# Patient Record
Sex: Female | Born: 1995 | Race: White | Hispanic: No | Marital: Single | Smoking: Never smoker
Health system: Southern US, Community
[De-identification: ages and names within clinical notes are randomized; demographics above are authoritative.]

---
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
## 2019-02-26 DIAGNOSIS — S60519A Abrasion of unspecified hand, initial encounter: Secondary | ICD-10-CM | POA: Diagnosis not present

## 2019-02-26 DIAGNOSIS — L03119 Cellulitis of unspecified part of limb: Secondary | ICD-10-CM | POA: Diagnosis not present

## 2019-03-10 DIAGNOSIS — S60519A Abrasion of unspecified hand, initial encounter: Secondary | ICD-10-CM | POA: Diagnosis not present

## 2019-04-15 DIAGNOSIS — Z23 Encounter for immunization: Secondary | ICD-10-CM | POA: Diagnosis not present

## 2019-04-15 DIAGNOSIS — Z0000 Encounter for general adult medical examination without abnormal findings: Secondary | ICD-10-CM | POA: Diagnosis not present

## 2020-04-14 ENCOUNTER — Emergency Department (HOSPITAL_COMMUNITY)
Admission: EM | Admit: 2020-04-14 | Discharge: 2020-04-20 | Disposition: A | Discharge status: Other Acute Inpatient Hospital | Payer: Medicare Other | Attending: Emergency Medicine | Admitting: Emergency Medicine

## 2020-04-14 ENCOUNTER — Encounter (HOSPITAL_COMMUNITY): Payer: Self-pay

## 2020-04-14 ENCOUNTER — Other Ambulatory Visit: Payer: Self-pay

## 2020-04-14 DIAGNOSIS — Z79899 Other long term (current) drug therapy: Secondary | ICD-10-CM | POA: Insufficient documentation

## 2020-04-14 DIAGNOSIS — Z20822 Contact with and (suspected) exposure to covid-19: Secondary | ICD-10-CM | POA: Insufficient documentation

## 2020-04-14 DIAGNOSIS — Z0489 Encounter for examination and observation for other specified reasons: Secondary | ICD-10-CM | POA: Diagnosis present

## 2020-04-14 DIAGNOSIS — F4310 Post-traumatic stress disorder, unspecified: Secondary | ICD-10-CM | POA: Insufficient documentation

## 2020-04-14 DIAGNOSIS — R462 Strange and inexplicable behavior: Secondary | ICD-10-CM

## 2020-04-14 DIAGNOSIS — F840 Autistic disorder: Secondary | ICD-10-CM | POA: Diagnosis not present

## 2020-04-14 DIAGNOSIS — F4320 Adjustment disorder, unspecified: Secondary | ICD-10-CM | POA: Diagnosis not present

## 2020-04-14 DIAGNOSIS — F79 Unspecified intellectual disabilities: Secondary | ICD-10-CM | POA: Insufficient documentation

## 2020-04-14 DIAGNOSIS — R4182 Altered mental status, unspecified: Secondary | ICD-10-CM | POA: Diagnosis not present

## 2020-04-14 DIAGNOSIS — Z03818 Encounter for observation for suspected exposure to other biological agents ruled out: Secondary | ICD-10-CM | POA: Diagnosis not present

## 2020-04-14 DIAGNOSIS — R000 Tachycardia, unspecified: Secondary | ICD-10-CM | POA: Diagnosis not present

## 2020-04-14 LAB — CBC WITH DIFFERENTIAL/PLATELET
Abs Immature Granulocytes: 0.03 10*3/uL (ref 0.00–0.07)
Basophils Absolute: 0 10*3/uL (ref 0.0–0.1)
Basophils Relative: 0 %
Eosinophils Absolute: 0 10*3/uL (ref 0.0–0.5)
Eosinophils Relative: 0 %
HCT: 39.5 % (ref 36.0–46.0)
Hemoglobin: 12.7 g/dL (ref 12.0–15.0)
Immature Granulocytes: 0 %
Lymphocytes Relative: 13 %
Lymphs Abs: 1 10*3/uL (ref 0.7–4.0)
MCH: 29.6 pg (ref 26.0–34.0)
MCHC: 32.2 g/dL (ref 30.0–36.0)
MCV: 92.1 fL (ref 80.0–100.0)
Monocytes Absolute: 0.6 10*3/uL (ref 0.1–1.0)
Monocytes Relative: 7 %
Neutro Abs: 6.2 10*3/uL (ref 1.7–7.7)
Neutrophils Relative %: 80 %
Platelets: 359 10*3/uL (ref 150–400)
RBC: 4.29 MIL/uL (ref 3.87–5.11)
RDW: 13.2 % (ref 11.5–15.5)
WBC: 7.9 10*3/uL (ref 4.0–10.5)
nRBC: 0 % (ref 0.0–0.2)

## 2020-04-14 LAB — COMPREHENSIVE METABOLIC PANEL
ALT: 16 U/L (ref 0–44)
AST: 22 U/L (ref 15–41)
Albumin: 4.9 g/dL (ref 3.5–5.0)
Alkaline Phosphatase: 66 U/L (ref 38–126)
Anion gap: 13 (ref 5–15)
BUN: 21 mg/dL — ABNORMAL HIGH (ref 6–20)
CO2: 20 mmol/L — ABNORMAL LOW (ref 22–32)
Calcium: 8.9 mg/dL (ref 8.9–10.3)
Chloride: 105 mmol/L (ref 98–111)
Creatinine, Ser: 0.92 mg/dL (ref 0.44–1.00)
GFR calc Af Amer: >60 mL/min (ref >60)
GFR calc non Af Amer: >60 mL/min (ref >60)
Glucose, Bld: 297 mg/dL — ABNORMAL HIGH (ref 70–99)
Potassium: 3.7 mmol/L (ref 3.5–5.1)
Sodium: 138 mmol/L (ref 135–145)
Total Bilirubin: 1.1 mg/dL (ref 0.3–1.2)
Total Protein: 7.7 g/dL (ref 6.5–8.1)

## 2020-04-14 LAB — ETHANOL: Alcohol, Ethyl (B): <10 mg/dL (ref <10)

## 2020-04-14 LAB — HCG, QUANTITATIVE, PREGNANCY: hCG, Beta Chain, Quant, S: <1 m[IU]/mL (ref <5)

## 2020-04-14 MED ORDER — ONDANSETRON HCL 4 MG PO TABS: 4.0000 mg | ORAL_TABLET | Freq: Three times a day (TID) | ORAL | Status: DC | PRN

## 2020-04-14 MED ORDER — SODIUM CHLORIDE 0.9 % IV BOLUS
1000.0000 mL | Freq: Once | INTRAVENOUS | Status: AC
  Administered 2020-04-14: 1000 mL via INTRAVENOUS

## 2020-04-14 MED ORDER — ACETAMINOPHEN 325 MG PO TABS: 650.0000 mg | ORAL_TABLET | ORAL | Status: DC | PRN

## 2020-04-14 MED ORDER — ALUM & MAG HYDROXIDE-SIMETH 200-200-20 MG/5ML PO SUSP: 30.0000 mL | Freq: Four times a day (QID) | ORAL | Status: DC | PRN

## 2020-04-14 NOTE — ED Triage Notes (Signed)
Pt brought to ED by aunt. Per aunt, last Monday pt has been nonverbal and fearful of others hurting her. Pt will not answer any questions in triage, pt presents as fearful of staff when attempting to take blood pressure, pt shaking and attempting to pull arm away.

## 2020-04-14 NOTE — ED Notes (Signed)
Pt's aunt states she has to leave. Contact information obtained so when TTS calls, they can gather collateral info from family. Family can be reached at [phone] Casimiro Needle Sandridge-adoptive uncle).

## 2020-04-14 NOTE — ED Provider Notes (Signed)
[facility] EMERGENCY DEPARTMENT Provider Note   CSN: [id number] Arrival date & time: 04/14/20  1647     History Chief Complaint  Patient presents with  . Medical Clearance    Bailey Bryan is a 24 y.o. female with no significant past medical history who presents to the ED with her aunt due to odd behavior for the past week.  Aunt is at bedside and provided the entire history for the patient.  During initial evaluation, patient is nonverbal and shaking at bedside.  Aunt notes that patient has been acting different for the past week after her birthday party which occurred on April 8.  Patient currently lives with her uncle and other aunt. Aunt at bedside notes that patient has stated "please don't get rid of me" and for her uncle "to protect her".  No previous mental health diagnosis.  No medical conditions.  Unable to asked patient any questions given that she is nonverbal at bedside.  Level 5 caveat secondary to psychiatric disorder.  History obtained from aunt and past medical records. No interpreter used during encounter.      History reviewed. No pertinent past medical history.  There are no problems to display for this patient.   History reviewed. No pertinent surgical history.   OB History   No obstetric history on file.     No family history on file.  Social History   Tobacco Use  . Smoking status: Not on file  Substance Use Topics  . Alcohol use: Not on file  . Drug use: Not on file    Home Medications Prior to Admission medications   Medication Sig Start Date End Date Taking? Authorizing Provider  acetaminophen (MIDOL) 650 MG CR tablet Take 650 mg by mouth every 8 (eight) hours as needed for pain or fever.   Yes [provider]  Multiple Vitamin (MULTIVITAMIN WITH MINERALS) TABS tablet Take 1 tablet by mouth daily.   Yes [provider]    Allergies    Penicillins  Review of Systems   Review of Systems  Unable to perform ROS:  Psychiatric disorder    Physical Exam Updated Vital Signs BP (!) 146/94 (BP Location: Right Arm)   Pulse (!) 134   Temp 98.1 F (36.7 C) (Tympanic)   Resp 18   Wt 48.1 kg   LMP 04/13/2020   SpO2 100%   Physical Exam Vitals and nursing note reviewed.  Constitutional:      General: She is not in acute distress.    Appearance: She is not toxic-appearing.     Comments: Nonverbal and shaking at bedside.  HENT:     Head: Normocephalic.  Eyes:     Pupils: Pupils are equal, round, and reactive to light.  Cardiovascular:     Rate and Rhythm: Regular rhythm. Tachycardia present.     Pulses: Normal pulses.     Heart sounds: Normal heart sounds. No murmur. No friction rub. No gallop.   Pulmonary:     Effort: Pulmonary effort is normal.     Breath sounds: Normal breath sounds.  Abdominal:     General: Abdomen is flat. There is no distension.     Palpations: Abdomen is soft.     Tenderness: There is no abdominal tenderness. There is no guarding or rebound.  Musculoskeletal:     Cervical back: Neck supple.     Comments: Able to move all 4 extremities without difficulty.  Skin:    General: Skin is warm and  dry.  Neurological:     General: No focal deficit present.     Mental Status: She is alert.  Psychiatric:        Mood and Affect: Mood is anxious.        Speech: She is noncommunicative.        Behavior: Behavior is withdrawn.     ED Results / Procedures / Treatments   Labs (all labs ordered are listed, but only abnormal results are displayed) Labs Reviewed  COMPREHENSIVE METABOLIC PANEL - Abnormal; Notable for the following components:      Result Value   CO2 20 (*)    Glucose, Bld 297 (*)    BUN 21 (*)    All other components within normal limits  RESPIRATORY PANEL BY RT PCR (FLU A&B, COVID)  ETHANOL  CBC WITH DIFFERENTIAL/PLATELET  HCG, QUANTITATIVE, PREGNANCY  RAPID URINE DRUG SCREEN, HOSP PERFORMED  HEMOGLOBIN A1C  POC URINE PREG, ED    EKG EKG  Interpretation  Date/Time:  Wednesday April 14 2020 18:55:26 EDT Ventricular Rate:  146 PR Interval:  114 QRS Duration: 82 QT Interval:  274 QTC Calculation: 426 R Axis:   77 Text Interpretation: Sinus tachycardia Otherwise normal ECG No old tracing to compare Confirmed by Eber Hong ([id number]) on 04/14/2020 7:31:42 PM   Radiology No results found.  Procedures Procedures (including critical care time)  Medications Ordered in ED Medications  sodium chloride 0.9 % bolus 1,000 mL (has no administration in time range)  acetaminophen (TYLENOL) tablet 650 mg (has no administration in time range)  ondansetron (ZOFRAN) tablet 4 mg (has no administration in time range)  alum & mag hydroxide-simeth (MAALOX/MYLANTA) 200-200-20 MG/5ML suspension 30 mL (has no administration in time range)    ED Course  I have reviewed the triage vital signs and the nursing notes.  Pertinent labs & imaging results that were available during my care of the patient were reviewed by me and considered in my medical decision making (see chart for details).  Clinical Course as of Apr 14 2129  Wed Apr 14, 2020  2111 Glucose(!): 297 [CA]    Clinical Course User Index [CA] Jesusita Oka   MDM Rules/Calculators/A&P                     24 year old female presents to the ED escorted by her aunt due to a possible psychotic break.  Patient has never been diagnosed with any mental health illnesses before.  No medical conditions.  Patient has been nonverbal and acting in an odd manner for the past week after her birthday party.  Aunt is unsure about any possible traumatic events within the past week.  Upon arrival, patient tachycardic at 134 with elevated BP at 146/94.  Patient is visibly shaking at bedside and is nonverbal and will not answer any questions.  Will obtain medical clearance labs and EKG given her tachycardia.  Patient is here voluntarily with her aunt.  CBC unremarkable with no leukocytosis.   CMP significant for hyperglycemia at 297 with no anion gap. No concern for DKA at this time. Will give 1 L IV fluids due to elevated glucose. Will order A1c for further evaluation of hyperglycemia.   Pregnancy test negative.  Ethanol within normal limits. Patient has been medically cleared for TTS evaluation. UDS and COVID test pending.   The patient has been placed in psychiatric observation due to the need to provide a safe environment for the patient while obtaining psychiatric  consultation and evaluation, as well as ongoing medical and medication management to treat the patient's condition.  The patient has not been placed under full IVC at this time.  Final Clinical Impression(s) / ED Diagnoses Final diagnoses:  Bizarre behavior    Rx / DC Orders ED Discharge Orders    None       Jesusita Oka 04/14/20 2130    Eber Hong, MD 04/15/20 1511

## 2020-04-15 ENCOUNTER — Emergency Department (HOSPITAL_COMMUNITY): Payer: Medicare Other

## 2020-04-15 DIAGNOSIS — F4310 Post-traumatic stress disorder, unspecified: Secondary | ICD-10-CM | POA: Diagnosis not present

## 2020-04-15 DIAGNOSIS — R4182 Altered mental status, unspecified: Secondary | ICD-10-CM | POA: Diagnosis not present

## 2020-04-15 LAB — RAPID URINE DRUG SCREEN, HOSP PERFORMED
Amphetamines: NOT DETECTED
Barbiturates: NOT DETECTED
Benzodiazepines: NOT DETECTED
Cocaine: NOT DETECTED
Opiates: NOT DETECTED
Tetrahydrocannabinol: NOT DETECTED

## 2020-04-15 LAB — CBG MONITORING, ED
Glucose-Capillary: 108 mg/dL — ABNORMAL HIGH (ref 70–99)
Glucose-Capillary: 144 mg/dL — ABNORMAL HIGH (ref 70–99)
Glucose-Capillary: 152 mg/dL — ABNORMAL HIGH (ref 70–99)
Glucose-Capillary: 99 mg/dL (ref 70–99)

## 2020-04-15 LAB — HEMOGLOBIN A1C
Hgb A1c MFr Bld: 5.4 % (ref 4.8–5.6)
Mean Plasma Glucose: 108.28 mg/dL

## 2020-04-15 LAB — RESPIRATORY PANEL BY RT PCR (FLU A&B, COVID)
Influenza A by PCR: NEGATIVE
Influenza B by PCR: NEGATIVE
SARS Coronavirus 2 by RT PCR: NEGATIVE

## 2020-04-15 MED ORDER — METFORMIN HCL 500 MG PO TABS
500.0000 mg | ORAL_TABLET | Freq: Two times a day (BID) | ORAL | Status: DC
  Administered 2020-04-15 – 2020-04-20 (×8): 500 mg via ORAL
  Filled 2020-04-15 (×9): qty 1

## 2020-04-15 MED ORDER — OLANZAPINE 5 MG PO TABS: 5.0000 mg | ORAL_TABLET | Freq: Every day | ORAL | Status: DC

## 2020-04-15 MED ORDER — OLANZAPINE 5 MG PO TABS: 5.0000 mg | ORAL_TABLET | Freq: Once | ORAL | Status: DC

## 2020-04-15 NOTE — ED Notes (Signed)
Spoke with guardian Casimiro Needle sandridge  He wants to make sure he is kept informed [phone]

## 2020-04-15 NOTE — Progress Notes (Signed)
Patient ID: Bailey Bryan, female   DOB: 1996/12/15, 24 y.o.   MRN: [id number]   Per Dr. Lucianne Muss, patient should be started on a low dose antipsychotic. I reviewed patients labs including EKG. There are no concerns with QTc interval. Ordered Zyprexa 5 mg po daily preferably at bedtime although patient may have her first dose now. We further discussed patients case and it was concluded that further collateral information would be needed before a disposition (inpatient hospitalization vs outpatient services with resources) is made.  [facility] team will  continue to reach out to patients guardian for additional information.

## 2020-04-15 NOTE — ED Notes (Signed)
Asked patient if she was sleepy and if she wanted the light off, she replied "a little bit, yes please". Turned light off for patient.
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
## 2020-04-15 NOTE — ED Notes (Signed)
TTS speaking with pt at this time. 

## 2020-04-15 NOTE — ED Provider Notes (Addendum)
Emergency Medicine Observation Re-evaluation Note  Bailey Bryan is a 24 y.o. female, seen on rounds today.  Pt initially presented to the ED for complaints of Medical Clearance Currently, the patient is awaiting TTS re-evaluation.  Physical Exam  BP 135/84 (BP Location: Left Arm)   Pulse 99   Temp 99 F (37.2 C) (Oral)   Resp 18   Wt 48.1 kg   LMP 04/13/2020   SpO2 98%  Physical Exam No acute distress.  Even, unlabored respirations.    ED Course / MDM  EKG:EKG Interpretation  Date/Time:  Wednesday April 14 2020 18:55:26 EDT Ventricular Rate:  146 PR Interval:  114 QRS Duration: 82 QT Interval:  274 QTC Calculation: 426 R Axis:   77 Text Interpretation: Sinus tachycardia Otherwise normal ECG No old tracing to compare Confirmed by Eber Hong ([id number]) on 04/14/2020 7:31:42 PM  Clinical Course as of Apr 15 730  Wed Apr 14, 2020  2111 Glucose(!): 297 [CA]    Clinical Course User Index [CA] Mannie Stabile, PA-C   I have reviewed the labs performed to date as well as medications administered while in observation.  No recent changes in the last 24 hours.  Plan  Current plan is for TTS re-evaluation. Home medication started. Will follow CBGs here in the ED and started Metformin with initial Glucose on the panel of 297 without an anion gap.   11:48 AM  CT head negative. CBG normal. Continue to follow. TTS attempting to collect collateral information prior to disposition.    Maia Plan, MD 04/15/20 9470    Maia Plan, MD 04/15/20 1149

## 2020-04-15 NOTE — ED Notes (Signed)
TTS evaluating pt at this time.  

## 2020-04-15 NOTE — Progress Notes (Signed)
Patient ID: Bailey Bryan, female   DOB: [birth year], 24 y.o.   MRN: [id number]   Psychiatric reassessment   HPI: Bailey Bryan is an 24 y.o. female.  -Clinician reviewed note by Charmaine Downs, [place].  Bailey Bryan a 24 y.o.femalewith no significant past medical history who presents to the ED with her aunt due to odd behavior for the past week. Aunt is at bedside and provided the entire history for the patient. During initial evaluation, patient is nonverbal and shaking at bedside. Aunt notes that patient has been acting different for the past week after her birthday party which occurred on April 8. Patient currently lives with her uncle and other aunt. Aunt at bedside notes that patient has stated "please don't get rid of me" and for her uncle "to protect her".No previous mental health diagnosis.  Clinician did attempt to assess patient.  She only responded yes to what her name was and did tell clinician her birthdate.  Pt only looked at screen and did not respond to any other interrogatives.  Clinician did talk at length to pt's guardian.  Clinician spoke with Bailey Bryan who is patient's guardian.  He said he had the guardianship papers at home but cannot locate them.  Clinician asked him to bring a copy to [facility] when he does find them.  Guardian said that patient had lived with him and his wife since pt was 51 years old.  Guardian's wife died in 2013/04/21 and guardian has cared for patient since, with the help of his sister, Bailey Bryan.  Guardian works Architect and is frequently out of town so [place] cares for patient when he is gone.    Guardian confirmed that patient was in special education classes in school.  He said "she has the mind of a teenager."  He did not know her exact I/DD dx.  He said she is used to routine and will talk a lot when she wants to.  He said she usually is a reserved person.  She can do her ADLs with minimal prompting and is independent with  toileting.  Patient had her birthday two weeks ago today.  Guardian was out of town and was sent pictures by his sister.  Patient appeared happy and to be enjoying herself.  He got home on 04/22/2023 and he said she was acting very strangely.  She did not talk much at all except to ask him "are you going to protect me?"  She would aske him and aunt if they were "going to get rid of her."    Patient has not been eating as much as usual.  She is not sleeping through the night.  Guardian related that patient knocked on his bedroom door a few nights ago at 3am and just stood in the hallway for an hour or more looking at him.  She usually takes care of the dog but one day she had the food scoop for the dog in her hand and stood staring at the bowl for several minutes and had to be verbally prompted to put the scoop in the bowl.    Guardian said that he and sister took her to see Dr. Kassie Bryan at [facility] (pt's PCP for over 10 years) yesterday.  Dr. Luciana Bryan tried to work with patient for an hour then suggested they bring her to [place].  Guardian had said that Dr. Luciana Bryan had suggested taking her to see a counselor in [place] also.  He did not  recall the name of the provider.  Pt has no previous inpatient psychiatric care.  Guardian is not aware fo any previous outpatient care.    Psychiatric evaluation: This is a 24 year old female who presented to the ED for acute behavioral concerns as noted above. During this evaluation, patient is alert and oriented to person, time and place, calm and cooperative. Per chart review and collateral information, patient recently became non-verbal. Patient was initially non-verbal when she presented to the ED however, she is now speaking in a very low, soft tone.  When specific questions are asked, she take significant long pauses and there are other times that the pauses are so long the she will eventually apply with," umm"  and  stare away. It is unclear if this is thought  blocking or associated with IDD and Autism. Her level of IDD and autism is also unclear although patients guardian was contacted today and stated he would bring additional information with him when visiting today at 5:00 pm. She did deny suicidal thoughts , homicidal thoughts and visual hallucinations. When asked about auditory hallucinations, she stated that she has heard voices telling her to harm herself and the last time she heard the voice was, "before coming here." She has no documented psychiatric history. Per CSW, after speaking to patients guardian, patients biological father has mental health issues which he belived to be Bipolar. When asked questions such as appetite, sleeping pattern, her meal for today, and thing that occurred at her  birthday party which on April 8, she was quick to respond. However, when asked questions n regards to her mental health, the responses again, came with long pauses. She was asked had anyone ever physically hurt her in the past and she replied, " no"  When asked if someone had ever touched her in areas that made her feel uncomfortable she paused, and never did answer the question.    Disposition: I am continuing to recommend Zyprexa 5 mg which first dose will be administered tonight instead of the original plan which was to be given earlier.  I have asked social worker to contact patients guardian since patient is now verbal,and eating to see if this is her baseline during visitation today.  Disposition at this time is overnight observation. Patient will start Zyprexa tonight and be assessed by psychiatry in the morning. .   Patient nurse update on disposition. Marland Kitchen

## 2020-04-15 NOTE — BH Assessment (Signed)
Tele Assessment Note   Patient Name: Bailey Bryan MRN: [id number] Referring Physician: Claudette Stapler, PA Location of Patient: [facility] Location of Provider: Behavioral Health TTS Department  Bailey Bryan is an 24 y.o. female.  -Clinician reviewed note by Claudette Stapler, PA.  Bailey Bryan is a 24 y.o. female with no significant past medical history who presents to the ED with her aunt due to odd behavior for the past week.  Aunt is at bedside and provided the entire history for the patient.  During initial evaluation, patient is nonverbal and shaking at bedside.  Aunt notes that patient has been acting different for the past week after her birthday party which occurred on April 8.  Patient currently lives with her uncle and other aunt. Aunt at bedside notes that patient has stated "please don't get rid of me" and for her uncle "to protect her".  No previous mental health diagnosis.  Clinician did attempt to assess patient.  She only responded yes to what her name was and did tell clinician her birthdate.  Pt only looked at screen and did not respond to any other interrogatives.  Clinician did talk at length to pt's guardian.  Clinician spoke with Sunnie Nielsen who is patient's guardian.  He said he had the guardianship papers at home but cannot locate them.  Clinician asked him to bring a copy to [facility] when he does find them.  Guardian said that patient had lived with him and his wife since pt was 5 years old.  Guardian's wife died in 05-01-13 and guardian has cared for patient since, with the help of his sister, Valarie Merino.  Guardian works Holiday representative and is frequently out of town so Diane cares for patient when he is gone.    Guardian confirmed that patient was in special education classes in school.  He said "she has the mind of a teenager."  He did not know her exact I/DD dx.  He said she is used to routine and will talk a lot when she wants to.  He said she usually is a  reserved person.  She can do her ADLs with minimal prompting and is independent with toileting.  Patient had her birthday two weeks ago today.  Guardian was out of town and was sent pictures by his sister.  Patient appeared happy and to be enjoying herself.  He got home on 04/14 and he said she was acting very strangely.  She did not talk much at all except to ask him "are you going to protect me?"  She would aske him and aunt if they were "going to get rid of her."    Patient has not been eating as much as usual.  She is not sleeping through the night.  Guardian related that patient knocked on his bedroom door a few nights ago at 3am and just stood in the hallway for an hour or more looking at him.  She usually takes care of the dog but one day she had the food scoop for the dog in her hand and stood staring at the bowl for several minutes and had to be verbally prompted to put the scoop in the bowl.    Guardian said that he and sister took her to see Dr. Roxine Caddy at [facility] (pt's PCP for over 10 years) yesterday.  Dr. Leavy Cella tried to work with patient for an hour then suggested they bring her to [facility].  Guardian had said that Dr. Leavy Cella  had suggested taking her to see a counselor in [place] also.  He did not recall the name of the provider.  Pt has no previous inpatient psychiatric care.  Guardian is not aware fo any previous outpatient care.    -Pt to be reviewed by psychiatry.  Clinician did talk to Dr. Nanda Quinton about patient.    Diagnosis: Intellectual Developmental d/o  Past Medical History: History reviewed. No pertinent past medical history.  History reviewed. No pertinent surgical history.  Family History: No family history on file.  Social History:  has no history on file for tobacco, alcohol, and drug.  Additional Social History:  Alcohol / Drug Use Pain Medications: None Prescriptions: None Over the Counter: None History of alcohol / drug use?: No history of alcohol  / drug abuse  CIWA: CIWA-Ar BP: 135/84 Pulse Rate: 99 COWS:    Allergies:  Allergies  Allergen Reactions  . Penicillins     Home Medications: (Not in a hospital admission)   OB/GYN Status:  Patient's last menstrual period was 04/13/2020.  General Assessment Data Assessment unable to be completed: Yes Reason for not completing assessment: Clinician attempted to complete pt's TTS assessment however the pt only communicated her name and birthday during the assessment. As clinician asked the pt a few more questions (SI, HI, etc) at times she would quietly whimper. Clinician asked the pt if she could contact her uncle to obtain collateral information, of what brought her to the hospital. Pt expressed to the pt she could shake her head "yes or on" if she does not want to speak. Clinician rephrased asking the pt if she could contact her family to gather collateral however she did not respond.  [facility] of Assessment: [facility] TTS Assessment: In system Is this a Tele or Face-to-Face Assessment?: Tele Assessment Is this an Initial Assessment or a Re-assessment for this encounter?: Initial Assessment Patient Accompanied by:: N/A Language Other than English: No Living Arrangements: Other (Comment)(Pt lives with uncle (since she was 38 years old)) What gender do you identify as?: Female Marital status: Single Pregnancy Status: No Living Arrangements: Other relatives(Living w/ uncle) Can pt return to current living arrangement?: Yes Admission Status: Voluntary Is patient capable of signing voluntary admission?: No Referral Source: Self/Family/Friend Insurance type: MCD     Crisis Care Plan Living Arrangements: Other relatives(Living w/ uncle) Legal Guardian: Other relative Name of Psychiatrist: None Name of Therapist: None  Education Status Is patient currently in school?: No Is the patient employed, unemployed or receiving disability?: Receiving disability income  Risk to self with  the past 6 months Suicidal Ideation: No Has patient been a risk to self within the past 6 months prior to admission? : No Suicidal Intent: No Has patient had any suicidal intent within the past 6 months prior to admission? : No Is patient at risk for suicide?: No Suicidal Plan?: No Has patient had any suicidal plan within the past 6 months prior to admission? : No Access to Means: No What has been your use of drugs/alcohol within the last 12 months?: N/A Previous Attempts/Gestures: No How many times?: 0 Other Self Harm Risks: None Triggers for Past Attempts: None known Intentional Self Injurious Behavior: None Family Suicide History: No Recent stressful life event(s): Other (Comment)(Unknown) Persecutory voices/beliefs?: Yes(Pt suspicious of family members.) Depression: No Depression Symptoms: (No evidence of depression.) Substance abuse history and/or treatment for substance abuse?: No Suicide prevention information given to non-admitted patients: Not applicable  Risk to Others within the past  6 months Homicidal Ideation: No Does patient have any lifetime risk of violence toward others beyond the six months prior to admission? : No Thoughts of Harm to Others: No Current Homicidal Intent: No Current Homicidal Plan: No Access to Homicidal Means: No Identified Victim: No one History of harm to others?: No Assessment of Violence: None Noted Violent Behavior Description: None reported Does patient have access to weapons?: No Criminal Charges Pending?: No Does patient have a court date: No Is patient on probation?: No  Psychosis Hallucinations: None noted Delusions: None noted  Mental Status Report Appearance/Hygiene: In scrubs Eye Contact: Good Motor Activity: Freedom of movement, Unremarkable Speech: Incoherent Level of Consciousness: Alert, Unresponsive (To pain or command) Mood: Helpless Affect: Blunted Anxiety Level: (Pt appears anxious.) Thought Processes: Thought  Blocking Judgement: Impaired Orientation: Appropriate for developmental age Obsessive Compulsive Thoughts/Behaviors: None  Cognitive Functioning Concentration: Unable to Assess Memory: Recent Impaired, Remote Intact Is patient IDD: Yes Level of Function: Mild Is IQ score available?: No Insight: Poor Impulse Control: Poor Appetite: Poor(Guardian said she has not been eating much.) Have you had any weight changes? : No Change Sleep: Decreased Total Hours of Sleep: (Staying up later.) Vegetative Symptoms: Decreased grooming(Pt needing to have reminders.)  ADLScreening [facility] Assessment Services) Patient's cognitive ability adequate to safely complete daily activities?: Yes Patient able to express need for assistance with ADLs?: Yes Independently performs ADLs?: Yes (appropriate for developmental age)  Prior Inpatient Therapy Prior Inpatient Therapy: No  Prior Outpatient Therapy Prior Outpatient Therapy: No Does patient have an ACCT team?: No Does patient have Intensive In-House Services?  : No Does patient have [facility] services? : No Does patient have P4CC services?: No  ADL Screening (condition at time of admission) Patient's cognitive ability adequate to safely complete daily activities?: Yes Patient able to express need for assistance with ADLs?: Yes Independently performs ADLs?: Yes (appropriate for developmental age)             Merchant navy officer (For Healthcare) Does Patient Have a Medical Advance Directive?: No Would patient like information on creating a medical advance directive?: No - Patient declined          Disposition:  Disposition Initial Assessment Completed for this Encounter: Yes Patient referred to: Other (Comment)(Psychiatric evaluation)  This service was provided via telemedicine using a 2-way, interactive audio and video technology.  Names of all persons participating in this telemedicine service and their role in this encounter. Name:  Ayo Sessums Role: patient  Name: Sunnie Nielsen Role: uncle / guardian  Name: Beatriz Stallion, M.S. LCAS QP Role: clinician  Name:  Role:     Alexandria Lodge 04/15/2020 8:42 AM

## 2020-04-15 NOTE — ED Notes (Signed)
Pt ate breakfast slowly, staff needed to assist and redirect.

## 2020-04-15 NOTE — ED Notes (Signed)
TTS placed at bedside.  

## 2020-04-15 NOTE — BHH Counselor (Signed)
Clinician attempted to complete pt's TTS assessment however the pt only communicated her name and birthday during the assessment. As clinician asked the pt a few more questions (SI, HI, etc) at times she would quietly whimper. Clinician asked the pt if she could contact her uncle to obtain collateral information, of what brought her to the hospital. Pt expressed to the pt she could shake her head "yes or on" if she does not want to speak. Clinician rephrased asking the pt if she could contact her family to gather collateral however she did not respond.    Discussed with Leotis Shames, RN. Pt to be assessed.    Redmond Pulling, MS, [facility], [facility] Triage Specialist [phone]

## 2020-04-15 NOTE — ED Notes (Signed)
Pt talking very quickly this morning. She is understanding and answers questions correctly. Her voice is slow and quite to answer but she is answering.

## 2020-04-15 NOTE — ED Notes (Signed)
Uncle came to visit pt, their inter-reaction was good. Uncle left copies of different papers for her chart. Example IEP from 2014. Uncle stated to said nurse that pt is not back to baseline. Stated she could laugh, joke, play, inter react with other children. No issues with coordination, or feeding herself , no need for redirection for simple ADL's. Uncle stated the new behaviors just started this week, being scared, not talking and frigidness.

## 2020-04-15 NOTE — ED Notes (Signed)
Spoke with BH and Zyprexa will begin tonight and re-eval in the morning

## 2020-04-16 DIAGNOSIS — F4310 Post-traumatic stress disorder, unspecified: Secondary | ICD-10-CM | POA: Diagnosis not present

## 2020-04-16 LAB — CBG MONITORING, ED
Glucose-Capillary: 90 mg/dL (ref 70–99)
Glucose-Capillary: 122 mg/dL — ABNORMAL HIGH (ref 70–99)
Glucose-Capillary: 107 mg/dL — ABNORMAL HIGH (ref 70–99)
Glucose-Capillary: 127 mg/dL — ABNORMAL HIGH (ref 70–99)

## 2020-04-16 MED ORDER — LORAZEPAM 1 MG PO TABS
1.0000 mg | ORAL_TABLET | Freq: Three times a day (TID) | ORAL | Status: AC
  Administered 2020-04-16: 1 mg via ORAL
  Filled 2020-04-16: qty 1

## 2020-04-16 MED ORDER — LORAZEPAM 1 MG PO TABS
2.0000 mg | ORAL_TABLET | Freq: Once | ORAL | Status: AC
  Administered 2020-04-16: 2 mg via ORAL
  Filled 2020-04-16: qty 2

## 2020-04-16 MED ORDER — LORAZEPAM 1 MG PO TABS: 1.0000 mg | ORAL_TABLET | Freq: Three times a day (TID) | ORAL | Status: DC

## 2020-04-16 NOTE — ED Provider Notes (Signed)
24 yo female here with erractic behavior, pending [facility] psychiatric evaluation.  Stable overnight, no acute complaints.  Pt started on zyprexa yesterday.  Pending [facility] re-evaluation today as they are attempting to reach pt's guardians.  Vitals wnl.    Terald Sleeper, MD 04/16/20 [phone]

## 2020-04-16 NOTE — BHH Counselor (Addendum)
Attempted to reassess patient today to determine level of care.  Patient is unable to engage and provided no responses.  She was observed staring at the Telehealth computer screen.  She was only able to nod once to confirm that her uncle is her guardian.    LPC attempted to reach patient's uncle (guardian), Casimiro Needle Sandridge([phone]).   A voicemail was left to request return call for discussion of treatment recommendations.  Reviewed case with Nanine Means, DNP.  Patient was started on medication recommended by Denzil Magnuson, NP and improvement is minimal per note from [employer].  RN spoke with patient's guardian after their visit and guardian states patient is "not back to baseline."  At this time inpatient treatment is recommended.

## 2020-04-16 NOTE — ED Notes (Signed)
Pt observed sitting in chair in room urinating on self. Pt then provided clean maroon scrubs, underwear, socks, towels, and hygiene materials to take shower and wear clean clothes. NT escorted Pt to shower room and Pt currently in shower.

## 2020-04-16 NOTE — Progress Notes (Signed)
ED staff Sappelt, Will Bonnet, RN has questions for TTS about medication reactions. Sappelt, Will Bonnet, RN advised to contact [facility] provider for assistance with medication recommendations as that is out of TTS counselor's area of expertise and informed Sappelt, Will Bonnet, RN that is a question for the provider. Contact information provided for [facility] provider.

## 2020-04-17 DIAGNOSIS — F4310 Post-traumatic stress disorder, unspecified: Secondary | ICD-10-CM | POA: Diagnosis not present

## 2020-04-17 LAB — CBG MONITORING, ED
Glucose-Capillary: 104 mg/dL — ABNORMAL HIGH (ref 70–99)
Glucose-Capillary: 123 mg/dL — ABNORMAL HIGH (ref 70–99)

## 2020-04-17 NOTE — ED Notes (Signed)
Pt escorted to BR by sitter; pt standing in BR but does not attempt to use BR; attempted to verbally prompt pt to use BR but she just stands and looks at you without responding; pt escorted back to room

## 2020-04-17 NOTE — Progress Notes (Addendum)
Pt is under review at [facility] but are requiring documentation of IDD other than HPI and IVC paperwork.   1536- CSW faxed guardianship paperwork, IEP, and SSA paperwork to [facility] to be reviewed for possible inpatient placement.   Ruthann Cancer MSW, LCSWA Clincal Social Worker Disposition  [facility] Ph: [phone] Fax: [phone]

## 2020-04-17 NOTE — ED Provider Notes (Signed)
Emergency Medicine Observation Re-evaluation Note  Bailey Bryan is a 24 y.o. female, seen on rounds today.  Pt initially presented to the ED for complaints of Medical Clearance Currently, the patient is awaiting placement  Physical Exam  BP 129/83 (BP Location: Right Arm)   Pulse (!) 113   Temp 98.1 F (36.7 C) (Oral)   Resp 16   Wt 48.1 kg   LMP 04/13/2020 Comment: neg preg  SpO2 99%  Physical Exam in nad,  alert  ED Course / MDM  EKG:EKG Interpretation  Date/Time:  Wednesday April 14 2020 18:55:26 EDT Ventricular Rate:  146 PR Interval:  114 QRS Duration: 82 QT Interval:  274 QTC Calculation: 426 R Axis:   77 Text Interpretation: Sinus tachycardia Otherwise normal ECG No old tracing to compare Confirmed by Eber Hong ([id number]) on 04/14/2020 7:31:42 PM  Clinical Course as of Apr 17 809  Wed Apr 14, 2020  2111 Glucose(!): 297 [CA]    Clinical Course User Index [CA] Mannie Stabile, PA-C   I have reviewed the labs performed to date as well as medications administered while in observation.  Recent changes in the last 24 hours include none Plan  Current plan is for placement    Bethann Berkshire, MD 04/17/20 [phone]

## 2020-04-18 DIAGNOSIS — F4310 Post-traumatic stress disorder, unspecified: Secondary | ICD-10-CM | POA: Diagnosis not present

## 2020-04-18 LAB — CBG MONITORING, ED
Glucose-Capillary: 121 mg/dL — ABNORMAL HIGH (ref 70–99)
Glucose-Capillary: 107 mg/dL — ABNORMAL HIGH (ref 70–99)

## 2020-04-18 MED ORDER — OLANZAPINE 5 MG PO TABS
5.0000 mg | ORAL_TABLET | Freq: Every day | ORAL | Status: DC
  Administered 2020-04-18 – 2020-04-19 (×2): 5 mg via ORAL
  Filled 2020-04-18: qty 1

## 2020-04-18 MED ORDER — LORAZEPAM 1 MG PO TABS: 1.0000 mg | ORAL_TABLET | Freq: Once | ORAL | Status: DC

## 2020-04-18 MED ORDER — LORAZEPAM 1 MG PO TABS
1.0000 mg | ORAL_TABLET | Freq: Once | ORAL | Status: AC
  Administered 2020-04-18: 15:00:00 1 mg via ORAL

## 2020-04-18 MED ORDER — LORAZEPAM 1 MG PO TABS
1.0000 mg | ORAL_TABLET | ORAL | Status: DC
  Administered 2020-04-18 (×2): 1 mg via ORAL
  Filled 2020-04-18 (×7): qty 1

## 2020-04-18 MED ORDER — LORAZEPAM 2 MG/ML IJ SOLN
2.0000 mg | Freq: Once | INTRAMUSCULAR | Status: AC
  Administered 2020-04-18: 2 mg via INTRAMUSCULAR
  Filled 2020-04-18: qty 1

## 2020-04-18 NOTE — ED Notes (Signed)
Pt sleeping comfortably. Regular, unlabored respirations. Even rise and fall of the chest.

## 2020-04-18 NOTE — ED Provider Notes (Signed)
Emergency Medicine Observation Re-evaluation Note  Bailey Bryan is a 24 y.o. female, seen on rounds today.  Pt initially presented to the ED for complaints of Medical Clearance Currently, the patient is awaiting placement  Physical Exam  BP (!) 138/91 (BP Location: Right Arm)   Pulse (!) 110   Temp 98.5 F (36.9 C) (Oral)   Resp 16   Wt 48.1 kg   LMP 04/13/2020 Comment: neg preg  SpO2 97%  Physical Exam alert in nad  ED Course / MDM  EKG:EKG Interpretation  Date/Time:  Wednesday April 14 2020 18:55:26 EDT Ventricular Rate:  146 PR Interval:  114 QRS Duration: 82 QT Interval:  274 QTC Calculation: 426 R Axis:   77 Text Interpretation: Sinus tachycardia Otherwise normal ECG No old tracing to compare Confirmed by Eber Hong ([id number]) on 04/14/2020 7:31:42 PM  Clinical Course as of Apr 18 736  Wed Apr 14, 2020  2111 Glucose(!): 297 [CA]    Clinical Course User Index [CA] Mannie Stabile, PA-C   I have reviewed the labs performed to date as well as medications administered while in observation.  Recent changes in the last 24 hours include none Plan  Current plan is for placement    Bethann Berkshire, MD 04/18/20 [phone]

## 2020-04-18 NOTE — ED Notes (Signed)
PT noted to be sitting on the edge of her bed this am and appears to be anxious but is non-verbal today. When asked if she is anxious she did make eye contact and blinked a few times and was noted to have a higher heart rate also. EDP made aware and new orders for po ativan entered and a order for TTS/[facility] to do a medication review and make recommendations for her.

## 2020-04-18 NOTE — Progress Notes (Signed)
CSW faxed referrals to the following facilities to be reviewed for inpatient placement:   [facility]  [facility]    Ruthann Cancer MSW, [place] Clincal Social Worker Disposition  [facility] Ph: [phone] Fax: [phone]
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
## 2020-04-18 NOTE — Progress Notes (Signed)
CSW spoke with Bailey Bryan at [facility] who stated that their provider did not feel that documentation provided was enough to admit to their IDD unit, however stated if more documentation could be obtained and provided they would be able to consider her for their unit.    CSW spoke with pt's legal guardian who stated that pt has a learning disability, but stated she did not have any formal diagnosis. Pt's guardian reported that she is able to function "like any normal person her age" other than struggling with learning when she was in school. Pt's guardian reported that he would rather her not be admitted inpatient and stated he would prefer a lower level of care attemtped first. CSW reviewed provider assessments and reasons for inpatient recommendation.    Bailey Bryan MSW, LCSWA Clincal Social Worker Disposition  [facility] Ph: [phone] Fax: [phone]

## 2020-04-19 DIAGNOSIS — F4310 Post-traumatic stress disorder, unspecified: Secondary | ICD-10-CM | POA: Diagnosis not present

## 2020-04-19 LAB — CBG MONITORING, ED
Glucose-Capillary: 91 mg/dL (ref 70–99)
Glucose-Capillary: 96 mg/dL (ref 70–99)

## 2020-04-19 NOTE — Progress Notes (Addendum)
Pt accepted to [facility]. Room to be determined.   Dr. Estill Cotta is the accepting and attending physician.   Call report to [phone].   Kaitlyn @ [facility] notified.     Pt is Voluntary.    Pt may be transported by [employer] [employer].   Pt scheduled to arrive on 04/20/2020 anytime after 7:00am.   Drucilla Schmidt, MSW, LCSW-A Clinical Disposition Social Worker [facility]/TTS [phone]

## 2020-04-19 NOTE — ED Notes (Signed)
Bailey Bryan, [facility], states pt has been accepted at [facility] and may arrive tomorrow.

## 2020-04-19 NOTE — ED Notes (Addendum)
Pt uncle/guardian in agreement to let patient go to [facility] after being educated on treatments provided at [facility].    Pt uncle's concern was related to misconceptions involving psychiatric hospitals. Pt uncle stated he thought the hospital would be a long term "home" for the patient. Informed that this was a short term treatment for pt that would include diagnosing and managing mental illness.   Luther Parody, [facility], notified that pt uncle/guardian in agreement with treatment plan.   Zella Ball, Consulting civil engineer, witnessed guardian consent.

## 2020-04-19 NOTE — Progress Notes (Addendum)
UPDATE: CSW received a return phone call from RN, Yvonna Alanis. Patient's uncle agreeable for her to seek treatment at [facility]. Patient is set to discharge tomorrow morning anytime after 7:00am.   CSW received a phone call from [employer] at [facility]. Patient's guardian isn't agreeable to [facility]. Per RN, the patient's uncle is wanting to take her home. CSW referred RN to speak with EDP and they make that decision.   CSW will continue to follow.   Drucilla Schmidt, MSW, LCSW-A Clinical Disposition Social Worker [facility]/TTS [phone]

## 2020-04-20 DIAGNOSIS — F4310 Post-traumatic stress disorder, unspecified: Secondary | ICD-10-CM | POA: Diagnosis not present

## 2020-04-20 LAB — CBG MONITORING, ED: Glucose-Capillary: 111 mg/dL — ABNORMAL HIGH (ref 70–99)

## 2020-04-20 NOTE — ED Notes (Addendum)
Spoke with Jamie Kato, pts uncle and made him aware of pt being transferred to [facility] this am.  Pt's unable states, "she is 47 and of age to not sign"   Pt's uncle agrees after making him aware of him being her legal guardian and verbal consent for signature must be obtained

## 2020-04-21 DIAGNOSIS — E162 Hypoglycemia, unspecified: Secondary | ICD-10-CM | POA: Diagnosis present

## 2020-04-21 DIAGNOSIS — F4311 Post-traumatic stress disorder, acute: Secondary | ICD-10-CM | POA: Diagnosis present

## 2020-04-21 DIAGNOSIS — F29 Unspecified psychosis not due to a substance or known physiological condition: Secondary | ICD-10-CM | POA: Diagnosis present

## 2020-04-21 DIAGNOSIS — Z62819 Personal history of unspecified abuse in childhood: Secondary | ICD-10-CM | POA: Diagnosis present

## 2020-04-21 DIAGNOSIS — F061 Catatonic disorder due to known physiological condition: Secondary | ICD-10-CM | POA: Diagnosis present

## 2020-04-30 ENCOUNTER — Encounter (HOSPITAL_COMMUNITY): Payer: Self-pay | Admitting: *Deleted

## 2020-04-30 ENCOUNTER — Emergency Department (HOSPITAL_COMMUNITY)
Admission: EM | Admit: 2020-04-30 | Discharge: 2020-05-07 | Disposition: A | Discharge status: Other Acute Inpatient Hospital | Payer: Medicare Other | Attending: Emergency Medicine | Admitting: Emergency Medicine

## 2020-04-30 DIAGNOSIS — R32 Unspecified urinary incontinence: Secondary | ICD-10-CM | POA: Insufficient documentation

## 2020-04-30 DIAGNOSIS — F4310 Post-traumatic stress disorder, unspecified: Secondary | ICD-10-CM | POA: Insufficient documentation

## 2020-04-30 DIAGNOSIS — R400 Somnolence: Secondary | ICD-10-CM | POA: Diagnosis not present

## 2020-04-30 DIAGNOSIS — Z20822 Contact with and (suspected) exposure to covid-19: Secondary | ICD-10-CM | POA: Diagnosis not present

## 2020-04-30 DIAGNOSIS — N3000 Acute cystitis without hematuria: Secondary | ICD-10-CM | POA: Diagnosis not present

## 2020-04-30 DIAGNOSIS — Z79899 Other long term (current) drug therapy: Secondary | ICD-10-CM | POA: Diagnosis not present

## 2020-04-30 DIAGNOSIS — F99 Mental disorder, not otherwise specified: Secondary | ICD-10-CM | POA: Diagnosis present

## 2020-04-30 LAB — URINALYSIS, ROUTINE W REFLEX MICROSCOPIC
Bilirubin Urine: NEGATIVE
Glucose, UA: NEGATIVE mg/dL
Hgb urine dipstick: NEGATIVE
Ketones, ur: NEGATIVE mg/dL
Nitrite: NEGATIVE
Protein, ur: NEGATIVE mg/dL
Specific Gravity, Urine: 1.021 (ref 1.005–1.030)
pH: 5 (ref 5.0–8.0)

## 2020-04-30 LAB — COMPREHENSIVE METABOLIC PANEL
ALT: 20 U/L (ref 0–44)
AST: 16 U/L (ref 15–41)
Albumin: 4.3 g/dL (ref 3.5–5.0)
Alkaline Phosphatase: 55 U/L (ref 38–126)
Anion gap: 10 (ref 5–15)
BUN: 8 mg/dL (ref 6–20)
CO2: 24 mmol/L (ref 22–32)
Calcium: 8.9 mg/dL (ref 8.9–10.3)
Chloride: 102 mmol/L (ref 98–111)
Creatinine, Ser: 0.43 mg/dL — ABNORMAL LOW (ref 0.44–1.00)
GFR calc Af Amer: >60 mL/min (ref >60)
GFR calc non Af Amer: >60 mL/min (ref >60)
Glucose, Bld: 108 mg/dL — ABNORMAL HIGH (ref 70–99)
Potassium: 3.3 mmol/L — ABNORMAL LOW (ref 3.5–5.1)
Sodium: 136 mmol/L (ref 135–145)
Total Bilirubin: 0.5 mg/dL (ref 0.3–1.2)
Total Protein: 6.6 g/dL (ref 6.5–8.1)

## 2020-04-30 LAB — CBC WITH DIFFERENTIAL/PLATELET
Abs Immature Granulocytes: 0.01 10*3/uL (ref 0.00–0.07)
Basophils Absolute: 0 10*3/uL (ref 0.0–0.1)
Basophils Relative: 0 %
Eosinophils Absolute: 0.1 10*3/uL (ref 0.0–0.5)
Eosinophils Relative: 1 %
HCT: 38.3 % (ref 36.0–46.0)
Hemoglobin: 12.5 g/dL (ref 12.0–15.0)
Immature Granulocytes: 0 %
Lymphocytes Relative: 28 %
Lymphs Abs: 1.7 10*3/uL (ref 0.7–4.0)
MCH: 30 pg (ref 26.0–34.0)
MCHC: 32.6 g/dL (ref 30.0–36.0)
MCV: 92.1 fL (ref 80.0–100.0)
Monocytes Absolute: 0.4 10*3/uL (ref 0.1–1.0)
Monocytes Relative: 7 %
Neutro Abs: 3.9 10*3/uL (ref 1.7–7.7)
Neutrophils Relative %: 64 %
Platelets: 339 10*3/uL (ref 150–400)
RBC: 4.16 MIL/uL (ref 3.87–5.11)
RDW: 12.7 % (ref 11.5–15.5)
WBC: 6.1 10*3/uL (ref 4.0–10.5)
nRBC: 0 % (ref 0.0–0.2)

## 2020-04-30 LAB — CBG MONITORING, ED: Glucose-Capillary: 114 mg/dL — ABNORMAL HIGH (ref 70–99)

## 2020-04-30 MED ORDER — SULFAMETHOXAZOLE-TRIMETHOPRIM 800-160 MG PO TABS
1.0000 | ORAL_TABLET | Freq: Two times a day (BID) | ORAL | Status: AC
  Administered 2020-04-30 – 2020-05-03 (×6): 1 via ORAL
  Filled 2020-04-30 (×5): qty 1

## 2020-04-30 MED ORDER — SULFAMETHOXAZOLE-TRIMETHOPRIM 800-160 MG PO TABS: 1.0000 | ORAL_TABLET | Freq: Two times a day (BID) | ORAL | 0 refills | Status: AC

## 2020-04-30 NOTE — ED Notes (Signed)
Patient's medications counted and given to pharmacy tech, Lowella Bandy to be locked up in pharmacy. Medications sent to pharmacy were Escitalopram and Lorazepam.

## 2020-04-30 NOTE — ED Notes (Signed)
Pt has been put in burgundy scrubs and belongings are in locker.

## 2020-04-30 NOTE — ED Triage Notes (Signed)
Family member state her condition has gotten worse since her discharge from [facility]. States she is incontinent of urine and stool now. States she does have a learning disability but can usually care for herself.

## 2020-04-30 NOTE — ED Provider Notes (Signed)
[facility] EMERGENCY DEPARTMENT Provider Note   CSN: [id number] Arrival date & time: 04/30/20  1403     History Chief Complaint  Patient presents with  . V70.1    Bailey Bryan is a 24 y.o. female.  HPI   This patient is a unfortunate 24 year old female who was diagnosed with an acute posttraumatic stress disorder and had a prolonged emergency department stay at the end of April 2021.  She was eventually sent to [facility] where she was evaluated and treated for the next 10 days by the psychiatry team, they started her on medications including an antidepressant as well as Abilify, the family who brings her back in today report that she has had a decompensation in the last 48 hours that she has been there, they report that she is incontinent of urine and feces since last night, she continues to be often not herself which concerns them and they are unsure how to care for her.  They were not given any information or instructions from the admitting team and the psychiatry unit other than that she was to follow-up in the outpatient setting and they were given a phone number.  They are unaware of the name of the clinic.  I have personally reviewed the discharge paperwork that they bring accompanying the patient which reports that they were supposed to follow-up with [facility], they have been unable to make an appointment there, they state that nobody is returning their calls.  The patient is not hallucinating, there has not been any other significant new abnormal behavior, there is no obvious hallucinations and no threats toward self or others.  The patient is not talking very much, level 5 caveat applies, she is not catatonic but is very somnolent appearing.  History reviewed. No pertinent past medical history.  Patient Active Problem List   Diagnosis Date Noted  . PTSD (post-traumatic stress disorder) 04/18/2020    History reviewed. No pertinent surgical history.   OB History   No  obstetric history on file.     No family history on file.  Social History   Tobacco Use  . Smoking status: Never Smoker  . Smokeless tobacco: Never Used  Substance Use Topics  . Alcohol use: Never  . Drug use: Never    Home Medications Prior to Admission medications   Medication Sig Start Date End Date Taking? Authorizing Provider  ARIPiprazole (ABILIFY) 5 MG tablet Take 5 mg by mouth at bedtime.   Yes [provider]  escitalopram (LEXAPRO) 10 MG tablet Take 10 mg by mouth at bedtime.   Yes [provider]  LORazepam (ATIVAN) 2 MG tablet Take 2 mg by mouth in the morning, at noon, and at bedtime.   Yes [provider]  melatonin 5 MG TABS Take 5 mg by mouth at bedtime.   Yes [provider]  sulfamethoxazole-trimethoprim (BACTRIM DS) 800-160 MG tablet Take 1 tablet by mouth 2 (two) times daily for 3 days. 04/30/20 05/03/20  Eber Hong, MD    Allergies    Penicillins  Review of Systems   Review of Systems  All other systems reviewed and are negative.   Physical Exam Updated Vital Signs BP 124/85 (BP Location: Right Arm)   Pulse (!) 101   Temp 98.4 F (36.9 C) (Oral)   Resp 18   Ht 1.6 m (5\' 3" )   LMP 04/13/2020 Comment: neg preg  SpO2 99%   BMI 18.78 kg/m   Physical Exam Vitals and nursing  note reviewed.  Constitutional:      General: She is not in acute distress.    Appearance: She is well-developed.  HENT:     Head: Normocephalic and atraumatic.     Mouth/Throat:     Pharynx: No oropharyngeal exudate.  Eyes:     General: No scleral icterus.       Right eye: No discharge.        Left eye: No discharge.     Conjunctiva/sclera: Conjunctivae normal.     Pupils: Pupils are equal, round, and reactive to light.  Neck:     Thyroid: No thyromegaly.     Vascular: No JVD.  Cardiovascular:     Rate and Rhythm: Regular rhythm. Tachycardia present.     Heart sounds: Normal heart sounds. No murmur. No friction rub. No gallop.     Pulmonary:     Effort: Pulmonary effort is normal. No respiratory distress.     Breath sounds: Normal breath sounds. No wheezing or rales.  Abdominal:     General: Bowel sounds are normal. There is no distension.     Palpations: Abdomen is soft. There is no mass.     Tenderness: There is no abdominal tenderness.  Musculoskeletal:        General: No tenderness. Normal range of motion.     Cervical back: Normal range of motion and neck supple.  Lymphadenopathy:     Cervical: No cervical adenopathy.  Skin:    General: Skin is warm and dry.     Findings: No erythema or rash.  Neurological:     Mental Status: She is alert.     Coordination: Coordination normal.     Comments: Somnolent, minimally verbal, follows commands, moves all 4 extremities, appears generally labile and weak  Psychiatric:        Behavior: Behavior normal.     Comments: The patient has a blunted withdrawn affect but is awake     ED Results / Procedures / Treatments   Labs (all labs ordered are listed, but only abnormal results are displayed) Labs Reviewed  COMPREHENSIVE METABOLIC PANEL - Abnormal; Notable for the following components:      Result Value   Potassium 3.3 (*)    Glucose, Bld 108 (*)    Creatinine, Ser 0.43 (*)    All other components within normal limits  URINALYSIS, ROUTINE W REFLEX MICROSCOPIC - Abnormal; Notable for the following components:   APPearance HAZY (*)    Leukocytes,Ua MODERATE (*)    Bacteria, UA RARE (*)    All other components within normal limits  CBG MONITORING, ED - Abnormal; Notable for the following components:   Glucose-Capillary 114 (*)    All other components within normal limits  URINE CULTURE  CBC WITH DIFFERENTIAL/PLATELET    EKG None  Radiology No results found.  Procedures Procedures (including critical care time)  Medications Ordered in ED Medications  sulfamethoxazole-trimethoprim (BACTRIM DS) 800-160 MG per tablet 1 tablet (1 tablet Oral Given  05/01/20 1013)  ARIPiprazole (ABILIFY) tablet 5 mg (has no administration in time range)  escitalopram (LEXAPRO) tablet 10 mg (has no administration in time range)  LORazepam (ATIVAN) tablet 2 mg (has no administration in time range)  melatonin tablet 4.5 mg (has no administration in time range)    ED Course  I have reviewed the triage vital signs and the nursing notes.  Pertinent labs & imaging results that were available during my care of the patient were reviewed by me and  considered in my medical decision making (see chart for details).  Clinical Course as of May 01 1442  Fri Apr 30, 2020  1855 UA with moderate leukocytes and 21-50 WBC with neg nitrite and rare bacteria - no leukocytosis - antibiotics and culture ordered - CMP without acute findings.   [BM]    Clinical Course User Index [BM] Eber Hong, MD   MDM Rules/Calculators/A&P                      This patient will need repeat evaluation by psychiatry, the family states that Abilify was not filled by the pharmacy, she has her other medications, she was sent home with "adult diapers" from [facility], thus assuming that there was some incontinence prior to her discharge.  This may be medication related.  They will need further advice from the psychiatry service on how to manage this patient at home and looking for other services.  She does not appear to be a danger to herself or others and is not hallucinating.  Additionally this patient did have some relative hypoglycemia, this will be rechecked, she is not a diabetic.  At change of shift, care signed out to Dr. Lynelle Doctor pending psychiatry recommendations  Final Clinical Impression(s) / ED Diagnoses Final diagnoses:  Acute cystitis without hematuria    Rx / DC Orders ED Discharge Orders         Ordered    sulfamethoxazole-trimethoprim (BACTRIM DS) 800-160 MG tablet  2 times daily     04/30/20 1858           Eber Hong, MD 05/01/20 1443

## 2020-05-01 DIAGNOSIS — N3000 Acute cystitis without hematuria: Secondary | ICD-10-CM | POA: Diagnosis not present

## 2020-05-01 MED ORDER — MELATONIN 3 MG PO TABS
4.5000 mg | ORAL_TABLET | Freq: Every day | ORAL | Status: DC
  Administered 2020-05-01 – 2020-05-05 (×5): 4.5 mg via ORAL
  Filled 2020-05-01 (×6): qty 2

## 2020-05-01 MED ORDER — ARIPIPRAZOLE 5 MG PO TABS
5.0000 mg | ORAL_TABLET | Freq: Every day | ORAL | Status: DC
  Administered 2020-05-01 – 2020-05-04 (×4): 5 mg via ORAL
  Filled 2020-05-01 (×4): qty 1

## 2020-05-01 MED ORDER — LORAZEPAM 1 MG PO TABS: 2.0000 mg | ORAL_TABLET | Freq: Four times a day (QID) | ORAL | Status: DC | PRN

## 2020-05-01 MED ORDER — ESCITALOPRAM OXALATE 10 MG PO TABS
10.0000 mg | ORAL_TABLET | Freq: Every day | ORAL | Status: DC
  Administered 2020-05-01 – 2020-05-05 (×5): 10 mg via ORAL
  Filled 2020-05-01 (×6): qty 1

## 2020-05-01 NOTE — ED Notes (Signed)
Pt still will not verbalize with RN. Smiles at [employer] [facility], but will not communicate with RN.
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
## 2020-05-01 NOTE — Progress Notes (Signed)
Disposition: [name], NP recommends inpt tx. EDP Eber Hong, MD and Salley Hews, RN have been advised. TTS to seek placement.

## 2020-05-01 NOTE — ED Notes (Signed)
Pt cooperative with taking medications however pt will not communicate with RN. No verbal communication.

## 2020-05-01 NOTE — Progress Notes (Signed)
Patient meets criteria for inpatient treatment per [name], NP. No appropriate beds at [facility] currently. CSW faxed referrals to the following facilities for review:  [facility]   [facility]   [facility]   [facility]  [facility]   [facility]   [facility]   [facility]  [facility]   [facility]   [facility]   [facility]   [facility]  TTS will continue to seek bed placement.   CSW spoke with admissions at [facility] who reported that their facility was currently at capacity however, it was reported that referral would be reviewed for possible future placement due to patient recently being discharged from their facility on 04/28/20.     Ruthann Cancer MSW, [facility] Social Worker Disposition  [facility] Ph: [phone] Fax: [phone] 05/01/2020 9:52 AM

## 2020-05-02 DIAGNOSIS — N3000 Acute cystitis without hematuria: Secondary | ICD-10-CM | POA: Diagnosis not present

## 2020-05-02 LAB — URINE CULTURE

## 2020-05-02 NOTE — ED Notes (Signed)
Sister is visiting pt, with permission of Kanyah Matsushima,RN.

## 2020-05-02 NOTE — ED Notes (Signed)
Pt walked to the bathroom by this nurse tech and the help of the RN, Lurena Joiner. Pt now in her room eating her breakfast and watching tv.

## 2020-05-02 NOTE — ED Notes (Signed)
Attempted to take pt to the bathroom due to pt sitting up indicating that she needs to use the bathroom. However, the pt did not urinate. Will continue to monitor pt.

## 2020-05-02 NOTE — BHH Counselor (Signed)
Pt remains in the ED due to apparent catatonia.  Pt was reassessed this AM.  As with her assessment, Pt did not respond to questions asked.  She stared to author, did not acknowledge questions.  No change from 05/01/20, recommend continued inpatient.

## 2020-05-02 NOTE — ED Provider Notes (Signed)
Emergency Medicine Observation Re-evaluation Note  Bailey Bryan is a 24 y.o. female, seen on rounds today.  Pt initially presented to the ED for complaints of V70.1 Currently, the patient is awaiting placement  Physical Exam  BP 125/80 (BP Location: Left Arm)   Pulse (!) 105   Temp 98.4 F (36.9 C) (Oral)   Resp 16   Ht 5\' 3"  (1.6 m)   LMP 04/13/2020 Comment: neg preg  SpO2 97%   BMI 18.78 kg/m  Physical Exam alert in nad  ED Course / MDM  EKG:  Clinical Course as of May 02 1350  Fri Apr 30, 2020  1855 UA with moderate leukocytes and 21-50 WBC with neg nitrite and rare bacteria - no leukocytosis - antibiotics and culture ordered - CMP without acute findings.   [BM]    Clinical Course User Index [BM] May 02, 2020, MD   I have reviewed the labs performed to date as well as medications administered while in observation.  Recent changes in the last 24 hours include none Plan  Current plan is for psyc placement    Eber Hong, MD 05/02/20 1351

## 2020-05-02 NOTE — ED Notes (Signed)
Walked pt out to the bathroom, since pt's sister said pt seemed like they needed to use the bathroom.Took pt to the bathroom, pt sat down on the toilet but never did void. Pt now in her room with her visitor. Will continue to monitor pt.

## 2020-05-03 DIAGNOSIS — N3000 Acute cystitis without hematuria: Secondary | ICD-10-CM | POA: Diagnosis not present

## 2020-05-03 LAB — CBC
HCT: 41.4 % (ref 36.0–46.0)
Hemoglobin: 13.9 g/dL (ref 12.0–15.0)
MCH: 30.2 pg (ref 26.0–34.0)
MCHC: 33.6 g/dL (ref 30.0–36.0)
MCV: 90 fL (ref 80.0–100.0)
Platelets: 400 10*3/uL (ref 150–400)
RBC: 4.6 MIL/uL (ref 3.87–5.11)
RDW: 13.2 % (ref 11.5–15.5)
WBC: 7.6 10*3/uL (ref 4.0–10.5)
nRBC: 0 % (ref 0.0–0.2)

## 2020-05-03 LAB — COMPREHENSIVE METABOLIC PANEL
ALT: 20 U/L (ref 0–44)
AST: 21 U/L (ref 15–41)
Albumin: 4.8 g/dL (ref 3.5–5.0)
Alkaline Phosphatase: 60 U/L (ref 38–126)
Anion gap: 11 (ref 5–15)
BUN: 14 mg/dL (ref 6–20)
CO2: 24 mmol/L (ref 22–32)
Calcium: 9.7 mg/dL (ref 8.9–10.3)
Chloride: 98 mmol/L (ref 98–111)
Creatinine, Ser: 0.62 mg/dL (ref 0.44–1.00)
GFR calc Af Amer: >60 mL/min (ref >60)
GFR calc non Af Amer: >60 mL/min (ref >60)
Glucose, Bld: 115 mg/dL — ABNORMAL HIGH (ref 70–99)
Potassium: 4.2 mmol/L (ref 3.5–5.1)
Sodium: 133 mmol/L — ABNORMAL LOW (ref 135–145)
Total Bilirubin: 0.6 mg/dL (ref 0.3–1.2)
Total Protein: 7.8 g/dL (ref 6.5–8.1)

## 2020-05-03 LAB — HCG, QUANTITATIVE, PREGNANCY: hCG, Beta Chain, Quant, S: <1 m[IU]/mL (ref <5)

## 2020-05-03 LAB — CBG MONITORING, ED
Glucose-Capillary: 154 mg/dL — ABNORMAL HIGH (ref 70–99)
Glucose-Capillary: 144 mg/dL — ABNORMAL HIGH (ref 70–99)
Glucose-Capillary: 158 mg/dL — ABNORMAL HIGH (ref 70–99)
Glucose-Capillary: 141 mg/dL — ABNORMAL HIGH (ref 70–99)

## 2020-05-03 LAB — TSH: TSH: 0.512 u[IU]/mL (ref 0.350–4.500)

## 2020-05-03 MED ORDER — CLONAZEPAM 0.5 MG PO TABS
0.5000 mg | ORAL_TABLET | Freq: Two times a day (BID) | ORAL | Status: DC
  Administered 2020-05-03 – 2020-05-06 (×7): 0.5 mg via ORAL
  Filled 2020-05-03 (×8): qty 1

## 2020-05-03 NOTE — ED Notes (Signed)
Pt currently non verbal   Would not feed herself and refused assist to eat  She was noted to have beads of sweat to her forehead and CBG done   She appears alert, but does not respond verbally even though she looks around her room   She does not follow request to move up in bed and is moved by staff so that she is more comfortable

## 2020-05-03 NOTE — ED Notes (Signed)
Report from [place], [place]

## 2020-05-03 NOTE — ED Triage Notes (Signed)
Pt has not eaten   Is alert and has answered slowly to questions by tech in monosyllabic answers

## 2020-05-03 NOTE — Progress Notes (Signed)
Patient ID: Bailey Bryan, female   DOB: February 06, 1996, 24 y.o.   MRN: [id number]   Recommendation per Dr. 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 Order has been placed.

## 2020-05-03 NOTE — ED Notes (Signed)
ED sitter notified this RN that pt becoming anxious and diaphoretic. Pt nonverbal at baseline per AM report. Vital signs obtained, CBG obtained. EDP aware and reported would assess pt chart. No new orders given.

## 2020-05-03 NOTE — ED Triage Notes (Signed)
Uncle reports that neighbors have spoken of "boyfriend" coming to pt home   We have thus far been unable to obtain a urine   Quant HGC is added to previous blood draw

## 2020-05-03 NOTE — ED Provider Notes (Signed)
Emergency Medicine Observation Re-evaluation Note  Bailey Bryan is a 24 y.o. female, seen on rounds today.  Pt initially presented to the ED for complaints of V70.1 Currently, the patient is awake, alert, sitting on side of bed, coloring in coloring book.   Physical Exam  BP (P) 130/80 (BP Location: Left Arm)   Pulse (!) (P) 128   Temp 98.7 F (37.1 C) (Oral)   Resp 16   Ht 1.6 m (5\' 3" )   LMP 04/13/2020 Comment: neg preg  SpO2 (P) 94%   BMI 18.78 kg/m  Physical Exam Alert, content appearing. Coloring in book. No verbal response/effort when communicating w patient.    ED Course / MDM  EKG:  Clinical Course as of May 03 809  Fri Apr 30, 2020  1855 UA with moderate leukocytes and 21-50 WBC with neg nitrite and rare bacteria - no leukocytosis - antibiotics and culture ordered - CMP without acute findings.   [BM]    Clinical Course User Index [BM] May 02, 2020, MD   I have reviewed the labs performed to date as well as medications administered while in observation.  Recent changes in the last 24 hours include nurse earlier noting pt appearing anxious/diaphoretic. Pt is afebrile, cbg then was normal. Pt currently calm and alert, coloring intently in book.  Plan  Current plan is for inpatient psychiatric tx.  Patient is under full IVC at this time.   Eber Hong, MD 05/03/20 [phone]

## 2020-05-03 NOTE — ED Notes (Signed)
Pt ate a couple bites of eggs. Pt kept reaching for the food, but would/could not pick it up. Pt is shaking and will not open her mouth for anymore food.

## 2020-05-03 NOTE — ED Notes (Signed)
meds given   Pt shaking and nurse assist to take meds and drink water   meds taken   Cracker offered to pt who put one half of a cracker into her mouth   Held it for several minutes and then spit it out   VS unreliable due to pt tremulousness  Will try later to find VS

## 2020-05-03 NOTE — ED Notes (Signed)
Introduced self to patient who meets eye contact  She answered questions slowly and in monosyllabic answers   She is informed that there is to be more lab work collected   And answers very slowly "ok"

## 2020-05-04 DIAGNOSIS — N3000 Acute cystitis without hematuria: Secondary | ICD-10-CM | POA: Diagnosis not present

## 2020-05-04 NOTE — ED Notes (Signed)
Pt has been smiling today, drinking apple juice and ate one bowl of fruit.  Placed attend and changed pants.

## 2020-05-04 NOTE — ED Notes (Signed)
Pt has drank several apple juices today.  Just fed pt cup of fruit and drinking water.

## 2020-05-05 DIAGNOSIS — N3000 Acute cystitis without hematuria: Secondary | ICD-10-CM | POA: Diagnosis not present

## 2020-05-05 LAB — URINALYSIS, ROUTINE W REFLEX MICROSCOPIC
Bacteria, UA: NONE SEEN
Bilirubin Urine: NEGATIVE
Glucose, UA: NEGATIVE mg/dL
Hgb urine dipstick: NEGATIVE
Ketones, ur: 5 mg/dL — AB
Leukocytes,Ua: NEGATIVE
Nitrite: NEGATIVE
Protein, ur: 30 mg/dL — AB
Specific Gravity, Urine: 1.026 (ref 1.005–1.030)
pH: 5 (ref 5.0–8.0)

## 2020-05-05 LAB — WET PREP, GENITAL
Sperm: NONE SEEN
Trich, Wet Prep: NONE SEEN
Yeast Wet Prep HPF POC: NONE SEEN

## 2020-05-05 MED ORDER — ARIPIPRAZOLE 5 MG PO TABS
5.0000 mg | ORAL_TABLET | Freq: Two times a day (BID) | ORAL | Status: DC
  Administered 2020-05-05 – 2020-05-06 (×2): 5 mg via ORAL
  Filled 2020-05-05 (×2): qty 1

## 2020-05-05 NOTE — ED Notes (Signed)
Pt resting quietly in rm, needs unable to be verbalized by pt

## 2020-05-05 NOTE — ED Notes (Signed)
Per previous RN, pt was unable to urinate during the day after given multiple beverages, bladder scan showed 530cc urine retained, EDP notified & I&O ordered to relieve pt. EDP asked to get UA on next I&O to evaluate for UTI presence & vaginal swab due to foul smelling discharge.

## 2020-05-05 NOTE — BH Assessment (Signed)
Reassessment Note: Pt presents lying on her back in hospital bed. Pt quickly makes eye contact with monitor. She appears to attempt speaking, but voice is inaudible. Pt raised her torso to a seated position without using her hands 4 x during assessment. Pt unable to communicate verbally at this time.

## 2020-05-05 NOTE — Progress Notes (Signed)
Patient ID: Bailey Bryan, female   DOB: 1996/04/25, 24 y.o.   MRN: [id number]   Reviewed medications. Patient currently on Abilify 5 mg po daily at bedtime. She has shown no improvement. I will increase Abilify to 5 mg po BID. Dr. Lucianne Muss discuss during bed meeting that patient may benefit from ECT. Per. Dr. Lucianne Muss, she will reach out to Dr. Jola Babinski at [facility] to discuss the possibility of ECT.  Patient continues to meet criteria for inpatient psychiatric hospitalization as discussed in bed meeting. Patient has been faxed out to a number of facilities. CSW will continue to follow-up on referrals.

## 2020-05-06 DIAGNOSIS — N3000 Acute cystitis without hematuria: Secondary | ICD-10-CM | POA: Diagnosis not present

## 2020-05-06 LAB — CBG MONITORING, ED: Glucose-Capillary: 118 mg/dL — ABNORMAL HIGH (ref 70–99)

## 2020-05-06 LAB — GC/CHLAMYDIA PROBE AMP (~~LOC~~) NOT AT ARMC
Chlamydia: NEGATIVE
Comment: NEGATIVE
Comment: NORMAL
Neisseria Gonorrhea: NEGATIVE

## 2020-05-06 LAB — SARS CORONAVIRUS 2 BY RT PCR (HOSPITAL ORDER, PERFORMED IN ~~LOC~~ HOSPITAL LAB): SARS Coronavirus 2: NEGATIVE

## 2020-05-06 MED ORDER — OLANZAPINE 5 MG PO TABS
2.5000 mg | ORAL_TABLET | Freq: Two times a day (BID) | ORAL | Status: DC
  Administered 2020-05-06: 2.5 mg via ORAL
  Filled 2020-05-06 (×2): qty 1

## 2020-05-06 NOTE — Progress Notes (Addendum)
Pt accepted to [facility]  Dr. Estill Cotta is the accepting/attending provider.    Call report to [phone]    Pt's RN @ [facility] notified.     Pt is voluntary and will be transported by [employer]  Pt is scheduled to arrive at [facility] on 05/07/20 after 8am.   Guardian has been informed and will call [facility]. CSW notified guardian and [facility] admissions staff that [facility] medical director Dr Lucianne Muss has recommended ECT for pt. ECT is offered at [facility] and will be further explored by guardian and [facility] staff as a possible treatment for pt.   Wells Guiles, LCSW, LCAS Disposition CSW [facility] BHH/TTS [phone]

## 2020-05-06 NOTE — ED Notes (Signed)
Pt ambulated to the restroom but didn't void. Pt cycle has started. RN notified.

## 2020-05-06 NOTE — Progress Notes (Signed)
Patient ID: Bailey Bryan, female   DOB: [birth year], 24 y.o.   MRN: [id number]  Per Dr. Lucianne Muss, discontinue Abilify and start Zyprexa 2.5 mg po bid. Updates have been made in [facility].

## 2020-05-06 NOTE — ED Notes (Addendum)
Spoke with Dr. Clayborne Dana about the patient's inability to void. Dr. Clayborne Dana stated to contact [facility] and see if patients Abilify and lexapro could be switched to other medications due to possible urinary retention. This information passed off to daytime RN.

## 2020-05-06 NOTE — ED Notes (Signed)
Patient assisted with drinking apple juice at this time.

## 2020-05-06 NOTE — ED Provider Notes (Signed)
Emergency Medicine Observation Re-evaluation Note  Bailey Bryan is a 24 y.o. female, seen on rounds today.  Pt initially presented to the ED for complaints of V70.1 Non verbal this am.  Physical Exam  BP (!) 135/95 (BP Location: Left Arm)   Pulse (!) 104   Temp 98.8 F (37.1 C) (Oral)   Resp 16   Ht 1.6 m (5\' 3" )   LMP 04/13/2020 Comment: neg preg  SpO2 98%   BMI 18.78 kg/m  Physical Exam Pt is awake, sitting up in bed No distress ED Course / MDM  EKG:  Clinical Course as of May 07 707  Fri Apr 30, 2020  1855 UA with moderate leukocytes and 21-50 WBC with neg nitrite and rare bacteria - no leukocytosis - antibiotics and culture ordered - CMP without acute findings.   [BM]    Clinical Course User Index [BM] May 02, 2020, MD   I have reviewed the labs performed to date as well as medications administered while in observation.  Recent changes in the last 24 hours include urine culture reviewed, multiple species. Plan  Current plan is for inpt tx. Patient is under full IVC at this time.   Eber Hong, MD 05/06/20 [phone]

## 2020-05-06 NOTE — ED Notes (Signed)
Attempted to give pt 2200 meds as ordered. Placed meds in pt's mouth and followed with water behind them. Pt would not swallow meds after multiple attempts. Had to suction meds back out of pt's mouth for this reason. Pt would not even drink water; instead just letting it run out of her mouth.

## 2020-05-06 NOTE — Progress Notes (Signed)
Pt meets inpatient criteria and has been referred to the following inpatient psychiatric hospitals:   [facility] Details [facility] Details [facility] Details [facility] Details   [facility] Details  [facility] Details [facility] Details  Details [facility] Details  [facility] Details  [facility] Details  [facility]   Disposition will continue to follow.   Wells Guiles, LCSW, LCAS Disposition CSW [facility] B[facility]/TTS [phone]

## 2020-05-06 NOTE — ED Notes (Signed)
Patient taken to the bathroom patient did not void.

## 2020-05-06 NOTE — ED Notes (Signed)
Pt ambulated to the restroom. Pt hasn't voided since 8am. RN notified.

## 2020-05-06 NOTE — ED Notes (Signed)
Pt ambulated to the restroom and voided.

## 2020-05-06 NOTE — ED Provider Notes (Signed)
I was informed by nursing that patient has had a problem with voiding for this last 24 hours and has had in and out catheters a couple times.  She has tried to void has not been able to and she had a bladder scan of 576 mL today.  She apparently presents to health issues and is somewhat catatonic.  Also appears review the records that she is on Lexapro and they recently increased her Abilify.  She has no history of urinary retention problems with passing versus medication side effect and change her medications well.  We will do an out catheter again now.  She also has a UTI but it appears that has been appropriately treated and is improving so would expect her to have urinary retention after the UTI got better.  But if her urinary retention continues even after medication adjustments may be urology consult would be needed.   Jontavious Commons, Barbara Cower, MD 05/06/20 [phone]

## 2020-05-06 NOTE — ED Notes (Signed)
Patient given oral fluids and patient did eat a small amount of jello. Patient given warm blankets.

## 2020-05-06 NOTE — BH Assessment (Signed)
Reassessment Note: Pt presents propped up in hospital bed. She quickly makes & maintains eye contact. When requested to wave back, pt slowly lifted her left hand and waved. She is non-verbal, but moving her lips. Pt may have been making a high-pitched noise- it was difficult to tell with ambient noise in room. Inpt tx continues to be recommended.

## 2020-05-07 DIAGNOSIS — F29 Unspecified psychosis not due to a substance or known physiological condition: Secondary | ICD-10-CM | POA: Diagnosis not present

## 2020-05-07 DIAGNOSIS — F061 Catatonic disorder due to known physiological condition: Secondary | ICD-10-CM | POA: Diagnosis present

## 2020-05-07 DIAGNOSIS — F4310 Post-traumatic stress disorder, unspecified: Secondary | ICD-10-CM | POA: Diagnosis present

## 2020-05-07 DIAGNOSIS — R4182 Altered mental status, unspecified: Secondary | ICD-10-CM | POA: Diagnosis present

## 2020-05-07 DIAGNOSIS — N3000 Acute cystitis without hematuria: Secondary | ICD-10-CM | POA: Diagnosis not present

## 2020-05-07 DIAGNOSIS — Z62819 Personal history of unspecified abuse in childhood: Secondary | ICD-10-CM | POA: Diagnosis present

## 2020-05-07 LAB — COMPREHENSIVE METABOLIC PANEL
ALT: 22 U/L (ref 0–44)
AST: 24 U/L (ref 15–41)
Albumin: 3.9 g/dL (ref 3.5–5.0)
Alkaline Phosphatase: 58 U/L (ref 38–126)
Anion gap: 10 (ref 5–15)
BUN: 12 mg/dL (ref 6–20)
CO2: 25 mmol/L (ref 22–32)
Calcium: 9 mg/dL (ref 8.9–10.3)
Chloride: 100 mmol/L (ref 98–111)
Creatinine, Ser: 0.47 mg/dL (ref 0.44–1.00)
GFR calc Af Amer: >60 mL/min (ref >60)
GFR calc non Af Amer: >60 mL/min (ref >60)
Glucose, Bld: 105 mg/dL — ABNORMAL HIGH (ref 70–99)
Potassium: 3.7 mmol/L (ref 3.5–5.1)
Sodium: 135 mmol/L (ref 135–145)
Total Bilirubin: 0.9 mg/dL (ref 0.3–1.2)
Total Protein: 6.7 g/dL (ref 6.5–8.1)

## 2020-05-07 NOTE — ED Notes (Signed)
[facility] called to arrange transportation for patient to [facility]

## 2020-05-07 NOTE — ED Provider Notes (Signed)
Emergency Medicine Observation Re-evaluation Note  Bailey Bryan is a 24 y.o. female, seen on rounds today.  Pt initially presented to the ED for complaints of V70.1 Currently, the patient is awaiting placement at [facility].  Physical Exam  BP 138/82 (BP Location: Right Arm)   Pulse 93   Temp 98.5 F (36.9 C) (Oral)   Resp 16   Ht 5\' 3"  (1.6 m)   LMP 04/13/2020 Comment: neg preg  SpO2 100%   BMI 18.78 kg/m  Physical Exam  Calm and cooperative.  Even, unlabored respirations.   ED Course / MDM  EKG:  Clinical Course as of May 08 1003  Fri Apr 30, 2020  1855 UA with moderate leukocytes and 21-50 WBC with neg nitrite and rare bacteria - no leukocytosis - antibiotics and culture ordered - CMP without acute findings.   [BM]    Clinical Course User Index [BM] May 02, 2020, MD   I have reviewed the labs performed to date as well as medications administered while in observation.  Recent changes in the last 24 hours include, none. Plan  Current plan is for transfer to [facility]. Accepting MD is Dr. [facility].   Patient is under full IVC at this time.   Estill Cotta, MD 05/07/20 1006

## 2020-05-07 NOTE — [facility] Counselor (Signed)
This counselor spoke with [facility] Admissions at [phone]. They state since patient has already been accepted she can still come and to go to [facility]. This counselor attempted to reach [facility] at [phone] to confirm and get accepting info. Phone ringed for 1 minute with no answer.

## 2020-05-07 NOTE — ED Notes (Signed)
IVC paperwork received for [facility]; report called to [facility] and given to [employer]

## 2020-05-07 NOTE — ED Notes (Signed)
Investment banker, corporate and they verified they have received IVC paperwork

## 2020-05-07 NOTE — ED Notes (Signed)
[employer] officer here to transport pt; pt is slow to respond and assisted to bathroom; pt unable to void; pt assisted to wheelchair and taken out

## 2020-05-07 NOTE — ED Notes (Signed)
Pt lying in bed with eyes open, pt given meal tray

## 2020-05-07 NOTE — ED Notes (Addendum)
Pt's legal guardian does not consent to have pt transferred to [facility] for treatment. [facility], [facility], and [facility] made aware.

## 2020-05-07 NOTE — Progress Notes (Signed)
CSW spoke with admissions staff at [facility]. They are aware that pt is now under IVC. CSW was told by Maralyn Sago in admissions that pt can be transported at anytime and does have a bed available at the facility.   Wells Guiles, LCSW, LCAS Disposition CSW [facility] [phone]

## 2020-05-07 NOTE — ED Notes (Signed)
This RN called legal guardian, Casimiro Needle, to notify him that [facility] and the EDP decided to IVC pt and send her to [facility]. Per Casimiro Needle "they discharged her in a worst state that what she wan sent in. Y'all are just full of shit."

## 2020-05-13 DIAGNOSIS — F819 Developmental disorder of scholastic skills, unspecified: Secondary | ICD-10-CM | POA: Diagnosis present

## 2020-05-13 DIAGNOSIS — G210 Malignant neuroleptic syndrome: Secondary | ICD-10-CM | POA: Diagnosis not present

## 2020-05-13 DIAGNOSIS — R29898 Other symptoms and signs involving the musculoskeletal system: Secondary | ICD-10-CM | POA: Diagnosis not present

## 2020-05-13 DIAGNOSIS — F79 Unspecified intellectual disabilities: Secondary | ICD-10-CM | POA: Diagnosis present

## 2020-05-13 DIAGNOSIS — F4325 Adjustment disorder with mixed disturbance of emotions and conduct: Secondary | ICD-10-CM | POA: Diagnosis present

## 2020-05-13 DIAGNOSIS — R404 Transient alteration of awareness: Secondary | ICD-10-CM | POA: Diagnosis not present

## 2020-05-13 DIAGNOSIS — F29 Unspecified psychosis not due to a substance or known physiological condition: Secondary | ICD-10-CM | POA: Diagnosis not present

## 2020-05-13 DIAGNOSIS — Z20822 Contact with and (suspected) exposure to covid-19: Secondary | ICD-10-CM | POA: Diagnosis present

## 2020-05-13 DIAGNOSIS — R292 Abnormal reflex: Secondary | ICD-10-CM | POA: Diagnosis not present

## 2020-05-13 DIAGNOSIS — F061 Catatonic disorder due to known physiological condition: Secondary | ICD-10-CM | POA: Diagnosis present

## 2020-05-13 DIAGNOSIS — R6250 Unspecified lack of expected normal physiological development in childhood: Secondary | ICD-10-CM | POA: Diagnosis present

## 2020-05-13 DIAGNOSIS — T7421XD Adult sexual abuse, confirmed, subsequent encounter: Secondary | ICD-10-CM | POA: Diagnosis not present

## 2020-05-13 DIAGNOSIS — R251 Tremor, unspecified: Secondary | ICD-10-CM | POA: Diagnosis present

## 2020-05-13 DIAGNOSIS — G249 Dystonia, unspecified: Secondary | ICD-10-CM | POA: Diagnosis not present

## 2020-05-13 DIAGNOSIS — R32 Unspecified urinary incontinence: Secondary | ICD-10-CM | POA: Diagnosis present

## 2020-05-13 DIAGNOSIS — R000 Tachycardia, unspecified: Secondary | ICD-10-CM | POA: Diagnosis present

## 2020-05-13 DIAGNOSIS — M2560 Stiffness of unspecified joint, not elsewhere classified: Secondary | ICD-10-CM | POA: Diagnosis present

## 2020-05-13 DIAGNOSIS — R4182 Altered mental status, unspecified: Secondary | ICD-10-CM | POA: Diagnosis not present

## 2020-05-13 DIAGNOSIS — F4310 Post-traumatic stress disorder, unspecified: Secondary | ICD-10-CM | POA: Diagnosis not present

## 2020-05-13 DIAGNOSIS — R61 Generalized hyperhidrosis: Secondary | ICD-10-CM | POA: Diagnosis present

## 2020-05-13 DIAGNOSIS — I959 Hypotension, unspecified: Secondary | ICD-10-CM | POA: Diagnosis not present

## 2020-05-13 DIAGNOSIS — F419 Anxiety disorder, unspecified: Secondary | ICD-10-CM | POA: Diagnosis present

## 2020-05-13 DIAGNOSIS — Z818 Family history of other mental and behavioral disorders: Secondary | ICD-10-CM | POA: Diagnosis not present

## 2020-05-13 DIAGNOSIS — Z79899 Other long term (current) drug therapy: Secondary | ICD-10-CM | POA: Diagnosis not present

## 2020-05-13 DIAGNOSIS — T50915A Adverse effect of multiple unspecified drugs, medicaments and biological substances, initial encounter: Secondary | ICD-10-CM | POA: Diagnosis present

## 2020-05-13 DIAGNOSIS — Z880 Allergy status to penicillin: Secondary | ICD-10-CM | POA: Diagnosis not present

## 2020-05-23 DIAGNOSIS — F4320 Adjustment disorder, unspecified: Secondary | ICD-10-CM | POA: Diagnosis not present

## 2020-05-23 DIAGNOSIS — F79 Unspecified intellectual disabilities: Secondary | ICD-10-CM | POA: Insufficient documentation

## 2020-05-23 DIAGNOSIS — F89 Unspecified disorder of psychological development: Secondary | ICD-10-CM | POA: Insufficient documentation

## 2020-05-24 DIAGNOSIS — F061 Catatonic disorder due to known physiological condition: Secondary | ICD-10-CM | POA: Diagnosis not present

## 2020-05-25 DIAGNOSIS — F89 Unspecified disorder of psychological development: Secondary | ICD-10-CM | POA: Diagnosis not present

## 2020-05-25 DIAGNOSIS — F061 Catatonic disorder due to known physiological condition: Secondary | ICD-10-CM | POA: Diagnosis not present

## 2020-05-26 DIAGNOSIS — F89 Unspecified disorder of psychological development: Secondary | ICD-10-CM | POA: Diagnosis not present

## 2020-05-26 DIAGNOSIS — F061 Catatonic disorder due to known physiological condition: Secondary | ICD-10-CM | POA: Diagnosis not present

## 2020-05-27 DIAGNOSIS — F89 Unspecified disorder of psychological development: Secondary | ICD-10-CM | POA: Diagnosis not present

## 2020-05-27 DIAGNOSIS — F061 Catatonic disorder due to known physiological condition: Secondary | ICD-10-CM | POA: Diagnosis not present

## 2020-05-28 DIAGNOSIS — F061 Catatonic disorder due to known physiological condition: Secondary | ICD-10-CM | POA: Diagnosis not present

## 2020-05-28 DIAGNOSIS — F89 Unspecified disorder of psychological development: Secondary | ICD-10-CM | POA: Diagnosis not present

## 2020-05-29 DIAGNOSIS — F061 Catatonic disorder due to known physiological condition: Secondary | ICD-10-CM | POA: Diagnosis not present

## 2020-05-30 DIAGNOSIS — F061 Catatonic disorder due to known physiological condition: Secondary | ICD-10-CM | POA: Diagnosis not present

## 2020-05-31 DIAGNOSIS — F061 Catatonic disorder due to known physiological condition: Secondary | ICD-10-CM | POA: Diagnosis not present

## 2020-06-01 DIAGNOSIS — F061 Catatonic disorder due to known physiological condition: Secondary | ICD-10-CM | POA: Diagnosis not present

## 2020-06-02 DIAGNOSIS — F061 Catatonic disorder due to known physiological condition: Secondary | ICD-10-CM | POA: Diagnosis not present

## 2020-06-03 DIAGNOSIS — F061 Catatonic disorder due to known physiological condition: Secondary | ICD-10-CM | POA: Diagnosis not present

## 2020-06-04 DIAGNOSIS — F061 Catatonic disorder due to known physiological condition: Secondary | ICD-10-CM | POA: Diagnosis not present

## 2020-06-05 DIAGNOSIS — F061 Catatonic disorder due to known physiological condition: Secondary | ICD-10-CM | POA: Diagnosis not present

## 2020-06-06 DIAGNOSIS — F061 Catatonic disorder due to known physiological condition: Secondary | ICD-10-CM | POA: Diagnosis not present

## 2020-06-07 DIAGNOSIS — F89 Unspecified disorder of psychological development: Secondary | ICD-10-CM | POA: Diagnosis not present

## 2020-06-07 DIAGNOSIS — F79 Unspecified intellectual disabilities: Secondary | ICD-10-CM | POA: Diagnosis not present

## 2020-06-07 DIAGNOSIS — F061 Catatonic disorder due to known physiological condition: Secondary | ICD-10-CM | POA: Diagnosis not present

## 2020-06-08 DIAGNOSIS — F89 Unspecified disorder of psychological development: Secondary | ICD-10-CM | POA: Diagnosis not present

## 2020-06-08 DIAGNOSIS — F061 Catatonic disorder due to known physiological condition: Secondary | ICD-10-CM | POA: Diagnosis not present

## 2020-06-08 DIAGNOSIS — F79 Unspecified intellectual disabilities: Secondary | ICD-10-CM | POA: Diagnosis not present

## 2020-06-09 DIAGNOSIS — F061 Catatonic disorder due to known physiological condition: Secondary | ICD-10-CM | POA: Diagnosis not present

## 2020-06-09 DIAGNOSIS — Z751 Person awaiting admission to adequate facility elsewhere: Secondary | ICD-10-CM | POA: Diagnosis not present

## 2020-06-09 DIAGNOSIS — F79 Unspecified intellectual disabilities: Secondary | ICD-10-CM | POA: Diagnosis not present

## 2020-06-09 DIAGNOSIS — F89 Unspecified disorder of psychological development: Secondary | ICD-10-CM | POA: Diagnosis not present

## 2020-06-10 DIAGNOSIS — F89 Unspecified disorder of psychological development: Secondary | ICD-10-CM | POA: Diagnosis not present

## 2020-06-10 DIAGNOSIS — Z751 Person awaiting admission to adequate facility elsewhere: Secondary | ICD-10-CM | POA: Diagnosis not present

## 2020-06-10 DIAGNOSIS — F79 Unspecified intellectual disabilities: Secondary | ICD-10-CM | POA: Diagnosis not present

## 2020-06-10 DIAGNOSIS — F061 Catatonic disorder due to known physiological condition: Secondary | ICD-10-CM | POA: Diagnosis not present

## 2020-06-11 DIAGNOSIS — Z751 Person awaiting admission to adequate facility elsewhere: Secondary | ICD-10-CM | POA: Diagnosis not present

## 2020-06-11 DIAGNOSIS — F89 Unspecified disorder of psychological development: Secondary | ICD-10-CM | POA: Diagnosis not present

## 2020-06-11 DIAGNOSIS — F061 Catatonic disorder due to known physiological condition: Secondary | ICD-10-CM | POA: Diagnosis not present

## 2020-06-12 DIAGNOSIS — Z751 Person awaiting admission to adequate facility elsewhere: Secondary | ICD-10-CM | POA: Diagnosis not present

## 2020-06-12 DIAGNOSIS — F89 Unspecified disorder of psychological development: Secondary | ICD-10-CM | POA: Diagnosis not present

## 2020-06-12 DIAGNOSIS — F061 Catatonic disorder due to known physiological condition: Secondary | ICD-10-CM | POA: Diagnosis not present

## 2020-06-13 DIAGNOSIS — F89 Unspecified disorder of psychological development: Secondary | ICD-10-CM | POA: Diagnosis not present

## 2020-06-13 DIAGNOSIS — Z751 Person awaiting admission to adequate facility elsewhere: Secondary | ICD-10-CM | POA: Diagnosis not present

## 2020-06-13 DIAGNOSIS — F061 Catatonic disorder due to known physiological condition: Secondary | ICD-10-CM | POA: Diagnosis not present

## 2020-06-14 DIAGNOSIS — Z751 Person awaiting admission to adequate facility elsewhere: Secondary | ICD-10-CM | POA: Diagnosis not present

## 2020-06-14 DIAGNOSIS — F89 Unspecified disorder of psychological development: Secondary | ICD-10-CM | POA: Diagnosis not present

## 2020-06-14 DIAGNOSIS — F061 Catatonic disorder due to known physiological condition: Secondary | ICD-10-CM | POA: Diagnosis not present

## 2020-06-17 ENCOUNTER — Ambulatory Visit: Payer: Medicare Other | Attending: Internal Medicine

## 2020-06-17 DIAGNOSIS — Z23 Encounter for immunization: Secondary | ICD-10-CM

## 2020-06-17 NOTE — Progress Notes (Signed)
   Covid-19 Vaccination Clinic  Name:  Bailey Bryan    MRN: [id number] DOB: [birth year]  06/17/2020  [name] was observed post Covid-19 immunization for 15 minutes without incident. She was provided with Vaccine Information Sheet and instruction to access the V-Safe system.   [name] was instructed to call 911 with any severe reactions post vaccine: Marland Kitchen Difficulty breathing  . Swelling of face and throat  . A fast heartbeat  . A bad rash all over body  . Dizziness and weakness   Immunizations Administered    Name Date Dose VIS Date Route   Moderna COVID-19 Vaccine 06/17/2020 10:15 AM 0.5 mL 11/2019 Intramuscular   Manufacturer: Moderna   Lot: [id number]   NDC: 49447-395-84

## 2020-06-30 DIAGNOSIS — R262 Difficulty in walking, not elsewhere classified: Secondary | ICD-10-CM | POA: Diagnosis not present

## 2020-06-30 DIAGNOSIS — F89 Unspecified disorder of psychological development: Secondary | ICD-10-CM | POA: Diagnosis not present

## 2020-06-30 DIAGNOSIS — M6281 Muscle weakness (generalized): Secondary | ICD-10-CM | POA: Diagnosis not present

## 2020-06-30 DIAGNOSIS — F4310 Post-traumatic stress disorder, unspecified: Secondary | ICD-10-CM | POA: Diagnosis not present

## 2020-06-30 DIAGNOSIS — F509 Eating disorder, unspecified: Secondary | ICD-10-CM | POA: Diagnosis not present

## 2020-06-30 DIAGNOSIS — F79 Unspecified intellectual disabilities: Secondary | ICD-10-CM | POA: Diagnosis not present

## 2020-06-30 DIAGNOSIS — R41841 Cognitive communication deficit: Secondary | ICD-10-CM | POA: Diagnosis not present

## 2020-07-05 DIAGNOSIS — R262 Difficulty in walking, not elsewhere classified: Secondary | ICD-10-CM | POA: Diagnosis not present

## 2020-07-05 DIAGNOSIS — F509 Eating disorder, unspecified: Secondary | ICD-10-CM | POA: Diagnosis not present

## 2020-07-05 DIAGNOSIS — F4310 Post-traumatic stress disorder, unspecified: Secondary | ICD-10-CM | POA: Diagnosis not present

## 2020-07-05 DIAGNOSIS — F79 Unspecified intellectual disabilities: Secondary | ICD-10-CM | POA: Diagnosis not present

## 2020-07-05 DIAGNOSIS — M6281 Muscle weakness (generalized): Secondary | ICD-10-CM | POA: Diagnosis not present

## 2020-07-05 DIAGNOSIS — F89 Unspecified disorder of psychological development: Secondary | ICD-10-CM | POA: Diagnosis not present

## 2020-07-06 DIAGNOSIS — M6281 Muscle weakness (generalized): Secondary | ICD-10-CM | POA: Diagnosis not present

## 2020-07-06 DIAGNOSIS — F4311 Post-traumatic stress disorder, acute: Secondary | ICD-10-CM | POA: Diagnosis not present

## 2020-07-06 DIAGNOSIS — F509 Eating disorder, unspecified: Secondary | ICD-10-CM | POA: Diagnosis not present

## 2020-07-06 DIAGNOSIS — R05 Cough: Secondary | ICD-10-CM | POA: Diagnosis not present

## 2020-07-06 DIAGNOSIS — F4310 Post-traumatic stress disorder, unspecified: Secondary | ICD-10-CM | POA: Diagnosis not present

## 2020-07-06 DIAGNOSIS — F89 Unspecified disorder of psychological development: Secondary | ICD-10-CM | POA: Diagnosis not present

## 2020-07-06 DIAGNOSIS — R262 Difficulty in walking, not elsewhere classified: Secondary | ICD-10-CM | POA: Diagnosis not present

## 2020-07-06 DIAGNOSIS — F79 Unspecified intellectual disabilities: Secondary | ICD-10-CM | POA: Diagnosis not present

## 2020-07-06 DIAGNOSIS — Z20828 Contact with and (suspected) exposure to other viral communicable diseases: Secondary | ICD-10-CM | POA: Diagnosis not present

## 2020-07-07 DIAGNOSIS — F509 Eating disorder, unspecified: Secondary | ICD-10-CM | POA: Diagnosis not present

## 2020-07-07 DIAGNOSIS — F4310 Post-traumatic stress disorder, unspecified: Secondary | ICD-10-CM | POA: Diagnosis not present

## 2020-07-07 DIAGNOSIS — F79 Unspecified intellectual disabilities: Secondary | ICD-10-CM | POA: Diagnosis not present

## 2020-07-07 DIAGNOSIS — R262 Difficulty in walking, not elsewhere classified: Secondary | ICD-10-CM | POA: Diagnosis not present

## 2020-07-07 DIAGNOSIS — M6281 Muscle weakness (generalized): Secondary | ICD-10-CM | POA: Diagnosis not present

## 2020-07-07 DIAGNOSIS — F89 Unspecified disorder of psychological development: Secondary | ICD-10-CM | POA: Diagnosis not present

## 2020-07-08 DIAGNOSIS — R262 Difficulty in walking, not elsewhere classified: Secondary | ICD-10-CM | POA: Diagnosis not present

## 2020-07-08 DIAGNOSIS — F509 Eating disorder, unspecified: Secondary | ICD-10-CM | POA: Diagnosis not present

## 2020-07-08 DIAGNOSIS — M6281 Muscle weakness (generalized): Secondary | ICD-10-CM | POA: Diagnosis not present

## 2020-07-08 DIAGNOSIS — F79 Unspecified intellectual disabilities: Secondary | ICD-10-CM | POA: Diagnosis not present

## 2020-07-08 DIAGNOSIS — F89 Unspecified disorder of psychological development: Secondary | ICD-10-CM | POA: Diagnosis not present

## 2020-07-08 DIAGNOSIS — F4310 Post-traumatic stress disorder, unspecified: Secondary | ICD-10-CM | POA: Diagnosis not present

## 2020-07-12 DIAGNOSIS — F4310 Post-traumatic stress disorder, unspecified: Secondary | ICD-10-CM | POA: Diagnosis not present

## 2020-07-12 DIAGNOSIS — R262 Difficulty in walking, not elsewhere classified: Secondary | ICD-10-CM | POA: Diagnosis not present

## 2020-07-12 DIAGNOSIS — M6281 Muscle weakness (generalized): Secondary | ICD-10-CM | POA: Diagnosis not present

## 2020-07-12 DIAGNOSIS — F89 Unspecified disorder of psychological development: Secondary | ICD-10-CM | POA: Diagnosis not present

## 2020-07-12 DIAGNOSIS — F509 Eating disorder, unspecified: Secondary | ICD-10-CM | POA: Diagnosis not present

## 2020-07-12 DIAGNOSIS — F79 Unspecified intellectual disabilities: Secondary | ICD-10-CM | POA: Diagnosis not present

## 2020-07-13 DIAGNOSIS — F4310 Post-traumatic stress disorder, unspecified: Secondary | ICD-10-CM | POA: Diagnosis not present

## 2020-07-13 DIAGNOSIS — F89 Unspecified disorder of psychological development: Secondary | ICD-10-CM | POA: Diagnosis not present

## 2020-07-13 DIAGNOSIS — M6281 Muscle weakness (generalized): Secondary | ICD-10-CM | POA: Diagnosis not present

## 2020-07-13 DIAGNOSIS — F79 Unspecified intellectual disabilities: Secondary | ICD-10-CM | POA: Diagnosis not present

## 2020-07-13 DIAGNOSIS — R262 Difficulty in walking, not elsewhere classified: Secondary | ICD-10-CM | POA: Diagnosis not present

## 2020-07-13 DIAGNOSIS — F509 Eating disorder, unspecified: Secondary | ICD-10-CM | POA: Diagnosis not present

## 2020-07-14 DIAGNOSIS — F509 Eating disorder, unspecified: Secondary | ICD-10-CM | POA: Diagnosis not present

## 2020-07-14 DIAGNOSIS — M6281 Muscle weakness (generalized): Secondary | ICD-10-CM | POA: Diagnosis not present

## 2020-07-14 DIAGNOSIS — F89 Unspecified disorder of psychological development: Secondary | ICD-10-CM | POA: Diagnosis not present

## 2020-07-14 DIAGNOSIS — F79 Unspecified intellectual disabilities: Secondary | ICD-10-CM | POA: Diagnosis not present

## 2020-07-14 DIAGNOSIS — Z23 Encounter for immunization: Secondary | ICD-10-CM | POA: Diagnosis not present

## 2020-07-14 DIAGNOSIS — F918 Other conduct disorders: Secondary | ICD-10-CM | POA: Diagnosis not present

## 2020-07-14 DIAGNOSIS — Z681 Body mass index (BMI) 19 or less, adult: Secondary | ICD-10-CM | POA: Diagnosis not present

## 2020-07-14 DIAGNOSIS — F061 Catatonic disorder due to known physiological condition: Secondary | ICD-10-CM | POA: Diagnosis not present

## 2020-07-14 DIAGNOSIS — R262 Difficulty in walking, not elsewhere classified: Secondary | ICD-10-CM | POA: Diagnosis not present

## 2020-07-14 DIAGNOSIS — F4310 Post-traumatic stress disorder, unspecified: Secondary | ICD-10-CM | POA: Diagnosis not present

## 2020-07-15 DIAGNOSIS — F419 Anxiety disorder, unspecified: Secondary | ICD-10-CM | POA: Diagnosis not present

## 2020-07-15 DIAGNOSIS — F4311 Post-traumatic stress disorder, acute: Secondary | ICD-10-CM | POA: Diagnosis not present

## 2020-07-15 DIAGNOSIS — Z79899 Other long term (current) drug therapy: Secondary | ICD-10-CM | POA: Diagnosis not present

## 2020-07-15 DIAGNOSIS — F79 Unspecified intellectual disabilities: Secondary | ICD-10-CM | POA: Diagnosis not present

## 2020-07-16 DIAGNOSIS — F89 Unspecified disorder of psychological development: Secondary | ICD-10-CM | POA: Diagnosis not present

## 2020-07-16 DIAGNOSIS — M6281 Muscle weakness (generalized): Secondary | ICD-10-CM | POA: Diagnosis not present

## 2020-07-16 DIAGNOSIS — F79 Unspecified intellectual disabilities: Secondary | ICD-10-CM | POA: Diagnosis not present

## 2020-07-16 DIAGNOSIS — F4310 Post-traumatic stress disorder, unspecified: Secondary | ICD-10-CM | POA: Diagnosis not present

## 2020-07-16 DIAGNOSIS — R262 Difficulty in walking, not elsewhere classified: Secondary | ICD-10-CM | POA: Diagnosis not present

## 2020-07-16 DIAGNOSIS — F509 Eating disorder, unspecified: Secondary | ICD-10-CM | POA: Diagnosis not present

## 2020-07-20 DIAGNOSIS — R262 Difficulty in walking, not elsewhere classified: Secondary | ICD-10-CM | POA: Diagnosis not present

## 2020-07-20 DIAGNOSIS — M6281 Muscle weakness (generalized): Secondary | ICD-10-CM | POA: Diagnosis not present

## 2020-07-20 DIAGNOSIS — F509 Eating disorder, unspecified: Secondary | ICD-10-CM | POA: Diagnosis not present

## 2020-07-20 DIAGNOSIS — F79 Unspecified intellectual disabilities: Secondary | ICD-10-CM | POA: Diagnosis not present

## 2020-07-20 DIAGNOSIS — F89 Unspecified disorder of psychological development: Secondary | ICD-10-CM | POA: Diagnosis not present

## 2020-07-20 DIAGNOSIS — F4310 Post-traumatic stress disorder, unspecified: Secondary | ICD-10-CM | POA: Diagnosis not present

## 2020-07-24 DIAGNOSIS — F509 Eating disorder, unspecified: Secondary | ICD-10-CM | POA: Diagnosis not present

## 2020-07-24 DIAGNOSIS — F4310 Post-traumatic stress disorder, unspecified: Secondary | ICD-10-CM | POA: Diagnosis not present

## 2020-07-24 DIAGNOSIS — F89 Unspecified disorder of psychological development: Secondary | ICD-10-CM | POA: Diagnosis not present

## 2020-07-24 DIAGNOSIS — F79 Unspecified intellectual disabilities: Secondary | ICD-10-CM | POA: Diagnosis not present

## 2020-07-24 DIAGNOSIS — M6281 Muscle weakness (generalized): Secondary | ICD-10-CM | POA: Diagnosis not present

## 2020-07-24 DIAGNOSIS — R262 Difficulty in walking, not elsewhere classified: Secondary | ICD-10-CM | POA: Diagnosis not present

## 2020-07-26 DIAGNOSIS — F79 Unspecified intellectual disabilities: Secondary | ICD-10-CM | POA: Diagnosis not present

## 2020-07-26 DIAGNOSIS — R262 Difficulty in walking, not elsewhere classified: Secondary | ICD-10-CM | POA: Diagnosis not present

## 2020-07-26 DIAGNOSIS — F4310 Post-traumatic stress disorder, unspecified: Secondary | ICD-10-CM | POA: Diagnosis not present

## 2020-07-26 DIAGNOSIS — F509 Eating disorder, unspecified: Secondary | ICD-10-CM | POA: Diagnosis not present

## 2020-07-26 DIAGNOSIS — F89 Unspecified disorder of psychological development: Secondary | ICD-10-CM | POA: Diagnosis not present

## 2020-07-26 DIAGNOSIS — M6281 Muscle weakness (generalized): Secondary | ICD-10-CM | POA: Diagnosis not present

## 2020-07-28 DIAGNOSIS — F79 Unspecified intellectual disabilities: Secondary | ICD-10-CM | POA: Diagnosis not present

## 2020-07-28 DIAGNOSIS — F89 Unspecified disorder of psychological development: Secondary | ICD-10-CM | POA: Diagnosis not present

## 2020-07-28 DIAGNOSIS — F509 Eating disorder, unspecified: Secondary | ICD-10-CM | POA: Diagnosis not present

## 2020-07-28 DIAGNOSIS — M6281 Muscle weakness (generalized): Secondary | ICD-10-CM | POA: Diagnosis not present

## 2020-07-28 DIAGNOSIS — R262 Difficulty in walking, not elsewhere classified: Secondary | ICD-10-CM | POA: Diagnosis not present

## 2020-07-28 DIAGNOSIS — F4310 Post-traumatic stress disorder, unspecified: Secondary | ICD-10-CM | POA: Diagnosis not present

## 2020-07-30 DIAGNOSIS — F79 Unspecified intellectual disabilities: Secondary | ICD-10-CM | POA: Diagnosis not present

## 2020-07-30 DIAGNOSIS — F509 Eating disorder, unspecified: Secondary | ICD-10-CM | POA: Diagnosis not present

## 2020-07-30 DIAGNOSIS — R41841 Cognitive communication deficit: Secondary | ICD-10-CM | POA: Diagnosis not present

## 2020-07-30 DIAGNOSIS — F4310 Post-traumatic stress disorder, unspecified: Secondary | ICD-10-CM | POA: Diagnosis not present

## 2020-07-30 DIAGNOSIS — F89 Unspecified disorder of psychological development: Secondary | ICD-10-CM | POA: Diagnosis not present

## 2020-07-30 DIAGNOSIS — R262 Difficulty in walking, not elsewhere classified: Secondary | ICD-10-CM | POA: Diagnosis not present

## 2020-07-30 DIAGNOSIS — M6281 Muscle weakness (generalized): Secondary | ICD-10-CM | POA: Diagnosis not present

## 2020-08-02 DIAGNOSIS — F419 Anxiety disorder, unspecified: Secondary | ICD-10-CM | POA: Diagnosis not present

## 2020-08-02 DIAGNOSIS — R05 Cough: Secondary | ICD-10-CM | POA: Diagnosis not present

## 2020-08-02 DIAGNOSIS — Z79899 Other long term (current) drug therapy: Secondary | ICD-10-CM | POA: Diagnosis not present

## 2020-08-02 DIAGNOSIS — Z20828 Contact with and (suspected) exposure to other viral communicable diseases: Secondary | ICD-10-CM | POA: Diagnosis not present

## 2020-08-03 DIAGNOSIS — R262 Difficulty in walking, not elsewhere classified: Secondary | ICD-10-CM | POA: Diagnosis not present

## 2020-08-03 DIAGNOSIS — F4310 Post-traumatic stress disorder, unspecified: Secondary | ICD-10-CM | POA: Diagnosis not present

## 2020-08-03 DIAGNOSIS — F79 Unspecified intellectual disabilities: Secondary | ICD-10-CM | POA: Diagnosis not present

## 2020-08-03 DIAGNOSIS — F509 Eating disorder, unspecified: Secondary | ICD-10-CM | POA: Diagnosis not present

## 2020-08-03 DIAGNOSIS — M6281 Muscle weakness (generalized): Secondary | ICD-10-CM | POA: Diagnosis not present

## 2020-08-03 DIAGNOSIS — F89 Unspecified disorder of psychological development: Secondary | ICD-10-CM | POA: Diagnosis not present

## 2020-08-06 DIAGNOSIS — R262 Difficulty in walking, not elsewhere classified: Secondary | ICD-10-CM | POA: Diagnosis not present

## 2020-08-06 DIAGNOSIS — F509 Eating disorder, unspecified: Secondary | ICD-10-CM | POA: Diagnosis not present

## 2020-08-06 DIAGNOSIS — F79 Unspecified intellectual disabilities: Secondary | ICD-10-CM | POA: Diagnosis not present

## 2020-08-06 DIAGNOSIS — F89 Unspecified disorder of psychological development: Secondary | ICD-10-CM | POA: Diagnosis not present

## 2020-08-06 DIAGNOSIS — F4310 Post-traumatic stress disorder, unspecified: Secondary | ICD-10-CM | POA: Diagnosis not present

## 2020-08-06 DIAGNOSIS — M6281 Muscle weakness (generalized): Secondary | ICD-10-CM | POA: Diagnosis not present

## 2020-09-03 DIAGNOSIS — I10 Essential (primary) hypertension: Secondary | ICD-10-CM | POA: Diagnosis not present

## 2020-09-03 DIAGNOSIS — R05 Cough: Secondary | ICD-10-CM | POA: Diagnosis not present

## 2020-09-03 DIAGNOSIS — Z20828 Contact with and (suspected) exposure to other viral communicable diseases: Secondary | ICD-10-CM | POA: Diagnosis not present

## 2020-09-14 DIAGNOSIS — R110 Nausea: Secondary | ICD-10-CM | POA: Diagnosis not present

## 2020-09-14 DIAGNOSIS — Z20828 Contact with and (suspected) exposure to other viral communicable diseases: Secondary | ICD-10-CM | POA: Diagnosis not present

## 2020-09-14 DIAGNOSIS — R05 Cough: Secondary | ICD-10-CM | POA: Diagnosis not present

## 2020-09-23 DIAGNOSIS — Z20828 Contact with and (suspected) exposure to other viral communicable diseases: Secondary | ICD-10-CM | POA: Diagnosis not present

## 2020-09-23 DIAGNOSIS — R0982 Postnasal drip: Secondary | ICD-10-CM | POA: Diagnosis not present

## 2020-09-23 DIAGNOSIS — J300 Vasomotor rhinitis: Secondary | ICD-10-CM | POA: Diagnosis not present

## 2020-09-23 DIAGNOSIS — J029 Acute pharyngitis, unspecified: Secondary | ICD-10-CM | POA: Diagnosis not present

## 2020-10-09 IMAGING — CT CT HEAD W/O CM
3 series · 16 of 44 positions shown, 19 images · non-contrast
Comparison: None.

CLINICAL DATA: Mental status changes.

EXAM:
CT HEAD WITHOUT CONTRAST
TECHNIQUE: Contiguous axial images were obtained from the base of the skull
through the vertex without intravenous contrast.

[Series 2: head w o · axial · 0.40mm/px · z∈[+24,+134]mm · 10 of 27 slices shown, 13 images]
[im 3/27  brain]
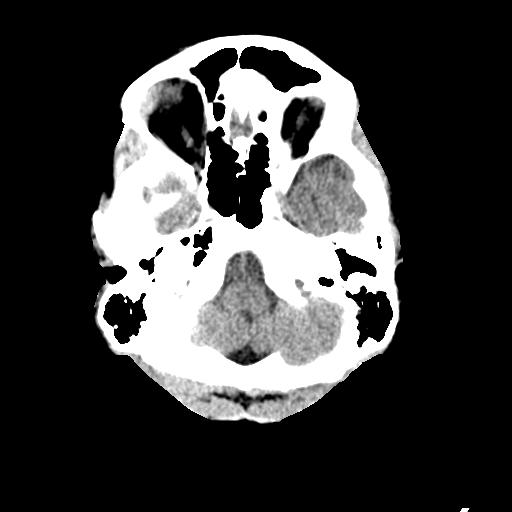
[im 3/27  bone]
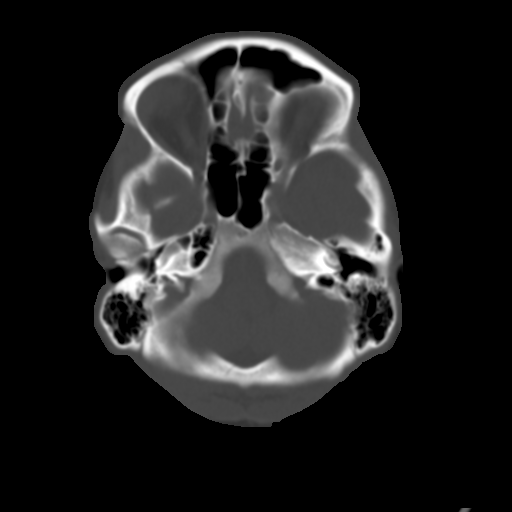
[im 5/27  brain]
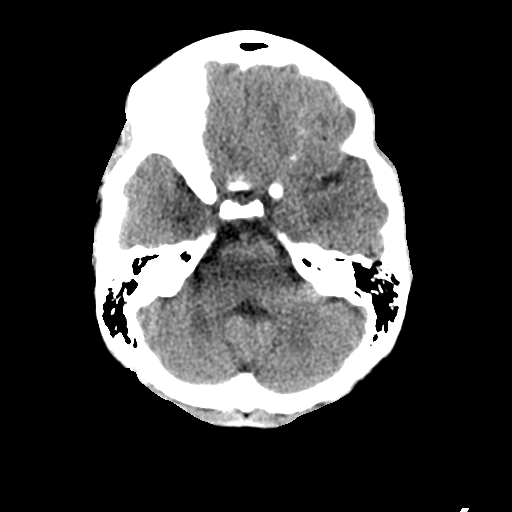
[im 8/27  brain]
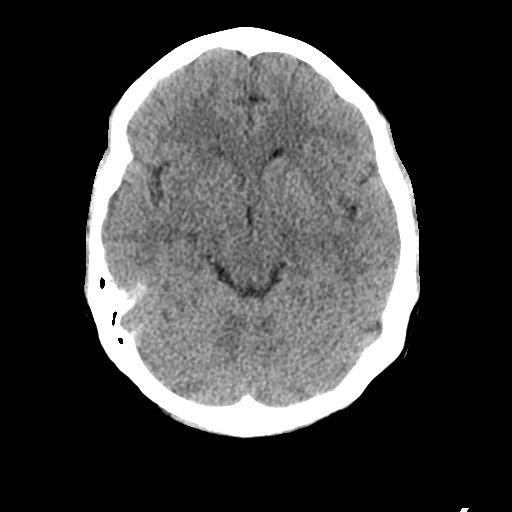
[im 10/27  brain]
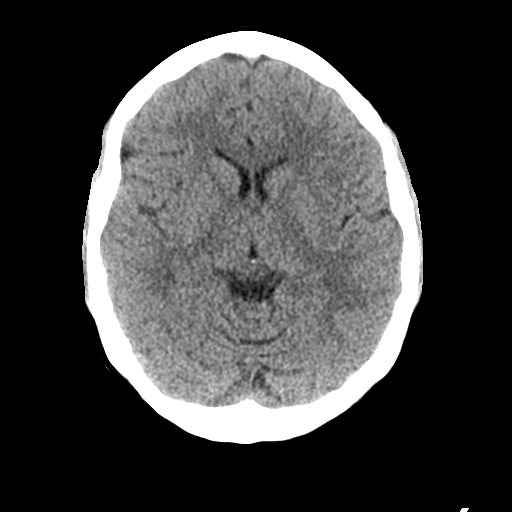
[im 13/27  brain]
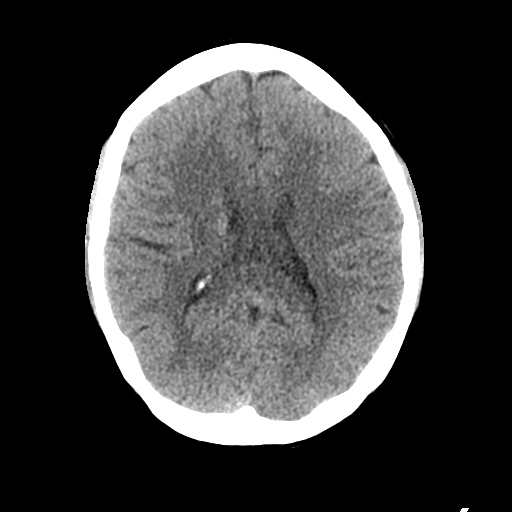
[im 13/27  bone]
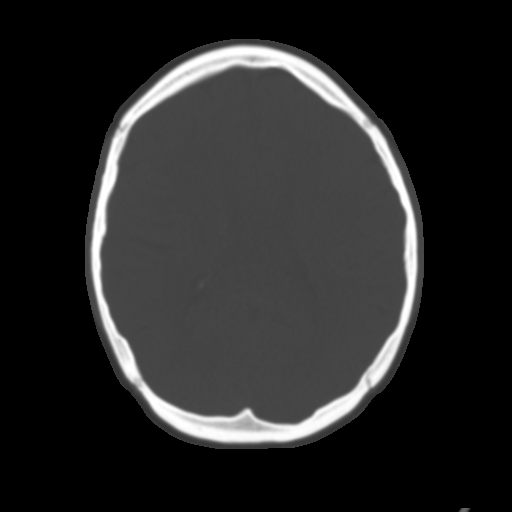
[im 15/27  brain]
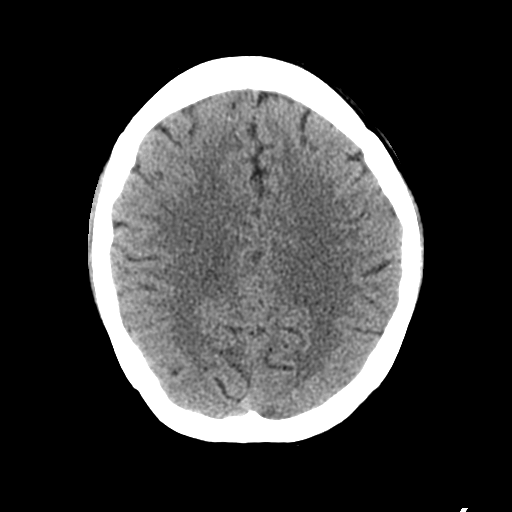
[im 18/27  brain]
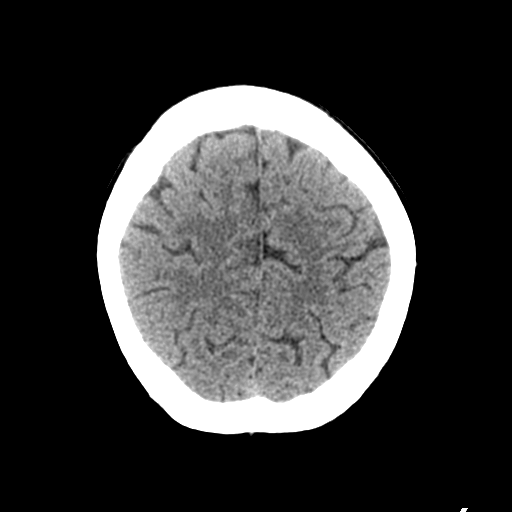
[im 20/27  brain]
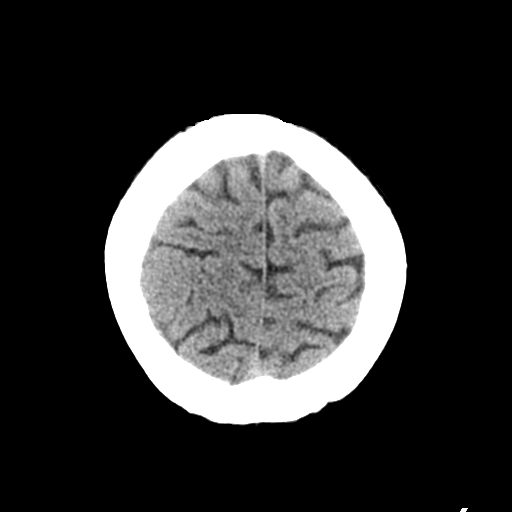
[im 23/27  brain]
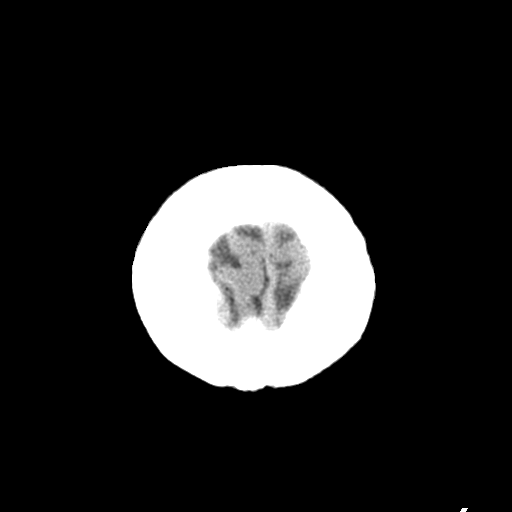
[im 23/27  bone]
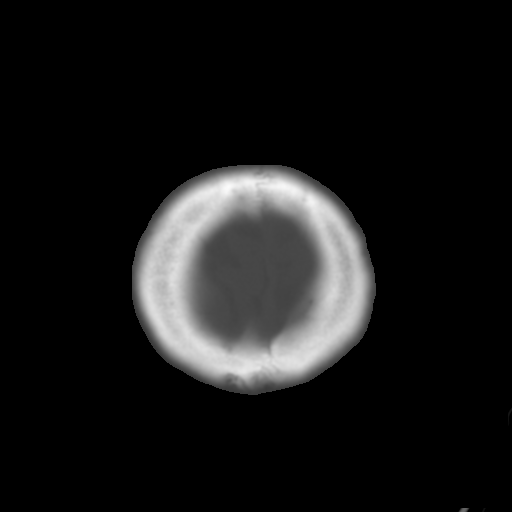
[im 25/27  brain]
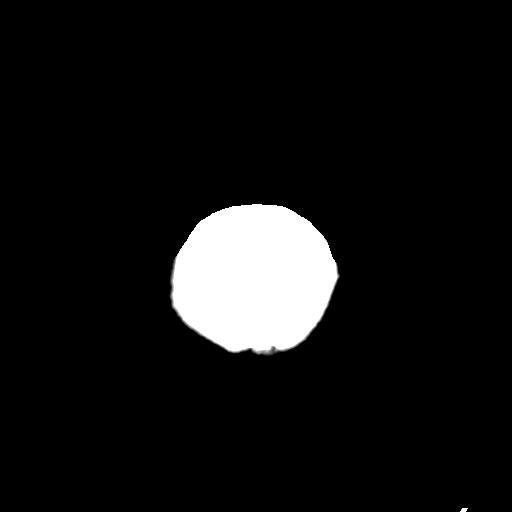

[Series 4: coronal soft · coronal · 0.33mm/px · 3 of 63 slices shown]
[im 21/63  brain]
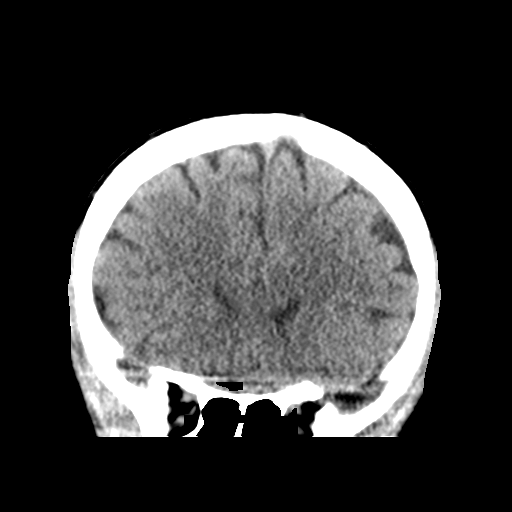
[im 28/63  brain]
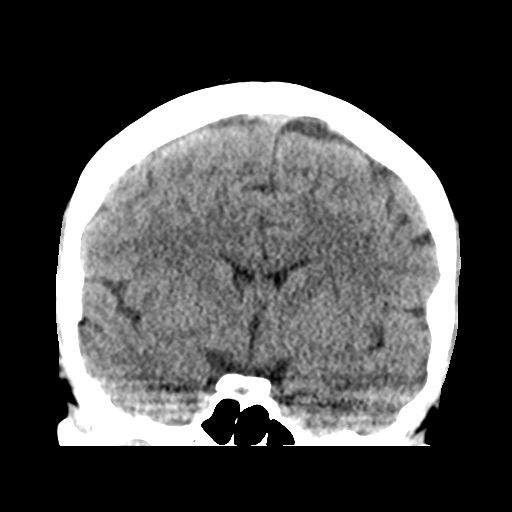
[im 35/63  brain]
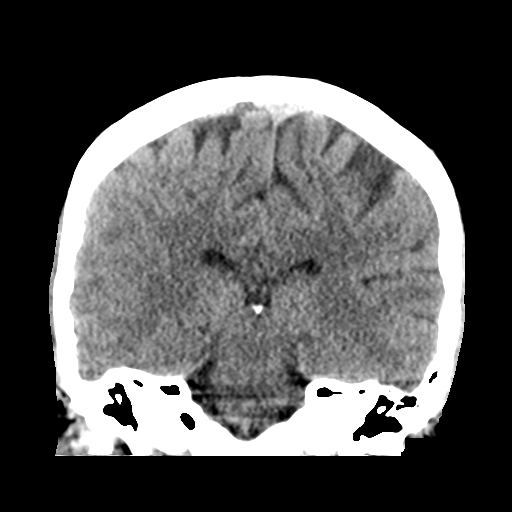

[Series 5: sagittal soft · sagittal · 0.31mm/px · 3 of 57 slices shown]
[im 19/57  brain]
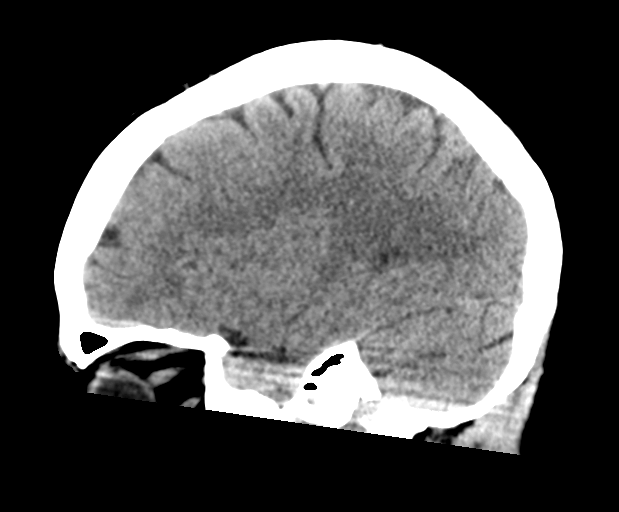
[im 29/57  brain]
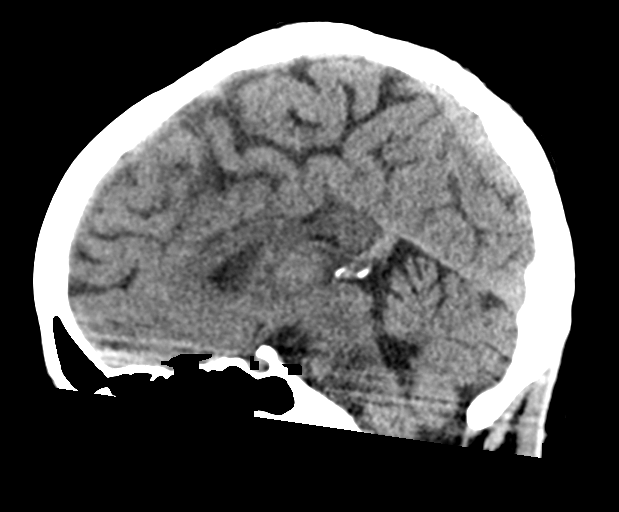
[im 38/57  brain]
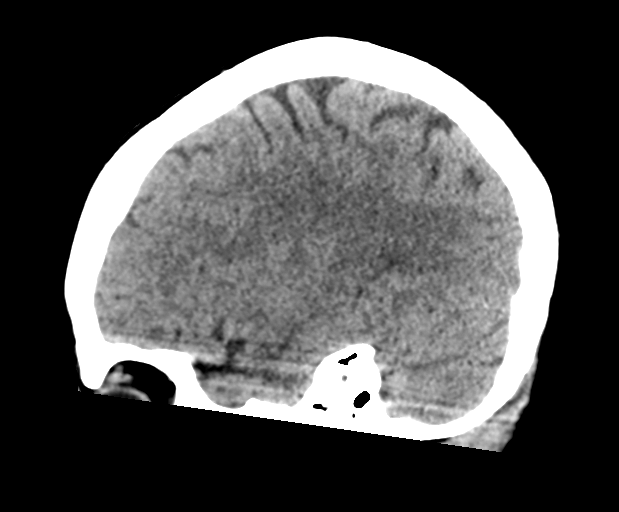

[16 of 44 positions shown; findings below may reference images not displayed]

FINDINGS: Brain: The brainstem, cerebellum, cerebral peduncles, thalami, basal
ganglia, basilar cisterns, and ventricular system appear within
normal limits. No intracranial hemorrhage, mass lesion, or acute
CVA.

Vascular: Unremarkable

Skull: Unremarkable

Sinuses/Orbits: Unremarkable

Other: No supplemental non-categorized findings.
IMPRESSION: No significant abnormality identified.

## 2020-10-19 DIAGNOSIS — F061 Catatonic disorder due to known physiological condition: Secondary | ICD-10-CM | POA: Diagnosis not present

## 2020-10-19 DIAGNOSIS — F79 Unspecified intellectual disabilities: Secondary | ICD-10-CM | POA: Diagnosis not present

## 2020-10-19 DIAGNOSIS — F918 Other conduct disorders: Secondary | ICD-10-CM | POA: Diagnosis not present

## 2020-10-19 DIAGNOSIS — Z6820 Body mass index (BMI) 20.0-20.9, adult: Secondary | ICD-10-CM | POA: Diagnosis not present

## 2020-10-19 DIAGNOSIS — Z23 Encounter for immunization: Secondary | ICD-10-CM | POA: Diagnosis not present

## 2020-10-19 DIAGNOSIS — F89 Unspecified disorder of psychological development: Secondary | ICD-10-CM | POA: Diagnosis not present

## 2020-11-04 DIAGNOSIS — F4311 Post-traumatic stress disorder, acute: Secondary | ICD-10-CM | POA: Diagnosis not present

## 2020-11-04 DIAGNOSIS — F329 Major depressive disorder, single episode, unspecified: Secondary | ICD-10-CM | POA: Diagnosis not present

## 2020-11-04 DIAGNOSIS — F419 Anxiety disorder, unspecified: Secondary | ICD-10-CM | POA: Diagnosis not present

## 2021-02-28 DIAGNOSIS — F329 Major depressive disorder, single episode, unspecified: Secondary | ICD-10-CM | POA: Diagnosis not present

## 2021-02-28 DIAGNOSIS — F79 Unspecified intellectual disabilities: Secondary | ICD-10-CM | POA: Diagnosis not present

## 2021-02-28 DIAGNOSIS — Z20828 Contact with and (suspected) exposure to other viral communicable diseases: Secondary | ICD-10-CM | POA: Diagnosis not present

## 2021-02-28 DIAGNOSIS — F419 Anxiety disorder, unspecified: Secondary | ICD-10-CM | POA: Diagnosis not present

## 2021-04-18 DIAGNOSIS — F89 Unspecified disorder of psychological development: Secondary | ICD-10-CM | POA: Diagnosis not present

## 2021-04-18 DIAGNOSIS — F79 Unspecified intellectual disabilities: Secondary | ICD-10-CM | POA: Diagnosis not present

## 2021-04-18 DIAGNOSIS — Z0000 Encounter for general adult medical examination without abnormal findings: Secondary | ICD-10-CM | POA: Diagnosis not present

## 2021-04-18 DIAGNOSIS — F918 Other conduct disorders: Secondary | ICD-10-CM | POA: Diagnosis not present

## 2021-04-18 DIAGNOSIS — F061 Catatonic disorder due to known physiological condition: Secondary | ICD-10-CM | POA: Diagnosis not present

## 2021-07-20 DIAGNOSIS — F4311 Post-traumatic stress disorder, acute: Secondary | ICD-10-CM | POA: Diagnosis not present

## 2021-07-20 DIAGNOSIS — F329 Major depressive disorder, single episode, unspecified: Secondary | ICD-10-CM | POA: Diagnosis not present

## 2021-07-20 DIAGNOSIS — F79 Unspecified intellectual disabilities: Secondary | ICD-10-CM | POA: Diagnosis not present

## 2021-10-13 DIAGNOSIS — F918 Other conduct disorders: Secondary | ICD-10-CM | POA: Diagnosis not present

## 2021-10-13 DIAGNOSIS — E781 Pure hyperglyceridemia: Secondary | ICD-10-CM | POA: Diagnosis not present

## 2021-10-13 DIAGNOSIS — Z13220 Encounter for screening for lipoid disorders: Secondary | ICD-10-CM | POA: Diagnosis not present

## 2021-10-13 DIAGNOSIS — F79 Unspecified intellectual disabilities: Secondary | ICD-10-CM | POA: Diagnosis not present

## 2021-10-13 DIAGNOSIS — F061 Catatonic disorder due to known physiological condition: Secondary | ICD-10-CM | POA: Diagnosis not present

## 2021-10-13 DIAGNOSIS — Z1329 Encounter for screening for other suspected endocrine disorder: Secondary | ICD-10-CM | POA: Diagnosis not present

## 2024-10-30 ENCOUNTER — Encounter: Admitting: Women's Health

## 2024-10-30 DIAGNOSIS — F4325 Adjustment disorder with mixed disturbance of emotions and conduct: Secondary | ICD-10-CM | POA: Insufficient documentation

## 2024-11-27 ENCOUNTER — Other Ambulatory Visit (HOSPITAL_COMMUNITY)
Admission: RE | Admit: 2024-11-27 | Discharge: 2024-11-27 | Disposition: A | Source: Ambulatory Visit | Attending: Women's Health | Admitting: Women's Health

## 2024-11-27 ENCOUNTER — Ambulatory Visit: Admitting: Women's Health

## 2024-11-27 ENCOUNTER — Encounter: Payer: Self-pay | Admitting: Women's Health

## 2024-11-27 VITALS — BP 104/68 | HR 74 | Ht 65.0 in | Wt 136.0 lb

## 2024-11-27 DIAGNOSIS — R87612 Low grade squamous intraepithelial lesion on cytologic smear of cervix (LGSIL): Secondary | ICD-10-CM

## 2024-11-27 DIAGNOSIS — Z3202 Encounter for pregnancy test, result negative: Secondary | ICD-10-CM | POA: Diagnosis not present

## 2024-11-27 DIAGNOSIS — N72 Inflammatory disease of cervix uteri: Secondary | ICD-10-CM | POA: Diagnosis not present

## 2024-11-27 DIAGNOSIS — R87619 Unspecified abnormal cytological findings in specimens from cervix uteri: Secondary | ICD-10-CM | POA: Insufficient documentation

## 2024-11-27 LAB — POCT URINE PREGNANCY: Preg Test, Ur: NEGATIVE

## 2024-11-27 NOTE — Patient Instructions (Signed)
 Colposcopy, Care After  The following information offers guidance on how to care for yourself after your procedure. Your health care provider may also give you more specific instructions. If you have problems or questions, contact your health care provider. What can I expect after the procedure? If you had a colposcopy without a biopsy, you can expect to feel fine right away after your procedure. However, you may have some spotting of blood for a few days. You can return to your normal activities. If you had a colposcopy with a biopsy, it is common after the procedure to have: Soreness and mild pain. These may last for a few days. Mild vaginal bleeding or discharge that is dark-colored and grainy. This may last for a few days. The discharge may be caused by a liquid (solution) that was used during the procedure. You may need to wear a sanitary pad during this time. Spotting of blood for at least 48 hours after the procedure. Follow these instructions at home: Medicines Take over-the-counter and prescription medicines only as told by your health care provider. Talk with your health care provider about what type of over-the-counter pain medicines and prescription medicines you can start to take again. It is especially important to talk with your health care provider if you take blood thinners. Activity Avoid using douche products, using tampons, and having sex for at least 3 days after the procedure or for as long as told by your health care provider. Return to your normal activities as told by your health care provider. Ask your health care provider what activities are safe for you. General instructions Ask your health care provider if you may take baths, swim, or use a hot tub. You may take showers. If you use birth control (contraception), continue to use it. Keep all follow-up visits. This is important. Contact a health care provider if: You have a fever or chills. You faint or feel  light-headed. Get help right away if: You have heavy bleeding from your vagina or pass blood clots. Heavy bleeding is bleeding that soaks through a sanitary pad in less than 1 hour. You have vaginal discharge that is abnormal, is yellow in color, or smells bad. This could be a sign of infection. You have severe pain or cramps in your lower abdomen that do not go away with medicine. Summary If you had a colposcopy without a biopsy, you can expect to feel fine right away, but you may have some spotting of blood for a few days. You can return to your normal activities. If you had a colposcopy with a biopsy, it is common to have mild pain for a few days and spotting for 48 hours after the procedure. Avoid using douche products, using tampons, and having sex for at least 3 days after the procedure or for as long as told by your health care provider. Get help right away if you have heavy bleeding, severe pain, or signs of infection. This information is not intended to replace advice given to you by your health care provider. Make sure you discuss any questions you have with your health care provider. Document Revised: 05/08/2021 Document Reviewed: 05/08/2021 Elsevier Patient Education  2024 [employer].

## 2024-11-27 NOTE — Addendum Note (Signed)
 Addended by: SANNA GONG A on: 11/27/2024 04:18 PM   Modules accepted: Orders

## 2024-11-27 NOTE — Addendum Note (Signed)
 Addended by: SANNA GONG A on: 11/27/2024 04:31 PM   Modules accepted: Orders

## 2024-11-27 NOTE — Progress Notes (Signed)
   COLPOSCOPY PROCEDURE NOTE Patient name: LOMETA RIGGIN MRN [id number]  Date of birth: 06/08/1996 Subjective Findings:   RAINIE CRENSHAW is a 28 y.o. G0P0000 Caucasian female being seen today for a colposcopy. Accompanied by worker from group home she is in.  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 Contraception: abstinence. Menopausal: no. Hysterectomy: no.   Considering pregnancy: No  Smoker: no. Immunocompromised: no.   The risks and benefits were explained and informed consent was obtained, and written copy is in chart. Pertinent History Reviewed:   Reviewed past medical,surgical, social, obstetrical and family history.  Reviewed problem list, medications and allergies. Objective Findings & Procedure:   Vitals:   11/27/24 1539  BP: 104/68  Pulse: 74  Weight: 136 lb (61.7 kg)  Height: 5' 5 (1.651 m)  Body mass index is 22.63 kg/m.  Results for orders placed or performed in visit on 11/27/24 (from the past 24 hours)  POCT urine pregnancy   Collection Time: 11/27/24  3:47 PM  Result Value Ref Range   Preg Test, Ur Negative Negative     Time out was performed.  Speculum placed in the vagina, cervix fully visualized. SCJ: fully visualized. Cervix swabbed x 3 with acetic acid.  Acetowhitening present: Yes Cervix: no visible lesions, no mosaicism, no punctation, no abnormal vasculature, and acetowhite lesion(s) noted at 8 o'clock. Cervical biopsies taken at 8 o'clock and Hemostasis achieved with Monsel's solution. Vagina: vaginal colposcopy not performed Vulva: vulvar colposcopy not performed  Specimens: 1  Complications: none  Chaperone: Winton Cherry  Colposcopic Impression & Plan:   Colposcopy findings consistent with LSIL Plan: Post biopsy instructions given, Will notify patient of results when back, and Will base plan of care on pathology results and ASCCP  guidelines  Return for after 8/27 for , Pap & physical.  Suzen JONELLE Fetters CNM, [facility] 11/27/2024 4:15 PM

## 2024-12-02 ENCOUNTER — Ambulatory Visit: Payer: Self-pay | Admitting: Women's Health

## 2024-12-02 LAB — SURGICAL PATHOLOGY
